# Patient Record
Sex: Female | Born: 1970
Health system: Southern US, Community
[De-identification: ages and names within clinical notes are randomized; demographics above are authoritative.]

## PROBLEM LIST (undated history)

## (undated) DIAGNOSIS — F419 Anxiety disorder, unspecified: Secondary | ICD-10-CM

## (undated) DIAGNOSIS — F4310 Post-traumatic stress disorder, unspecified: Secondary | ICD-10-CM

## (undated) DIAGNOSIS — F319 Bipolar disorder, unspecified: Secondary | ICD-10-CM

## (undated) DIAGNOSIS — M545 Low back pain, unspecified: Secondary | ICD-10-CM

## (undated) DIAGNOSIS — Z86718 Personal history of other venous thrombosis and embolism: Secondary | ICD-10-CM

## (undated) DIAGNOSIS — I890 Lymphedema, not elsewhere classified: Secondary | ICD-10-CM

## (undated) DIAGNOSIS — I872 Venous insufficiency (chronic) (peripheral): Secondary | ICD-10-CM

## (undated) DIAGNOSIS — I824Y2 Acute embolism and thrombosis of unspecified deep veins of left proximal lower extremity: Secondary | ICD-10-CM

## (undated) DIAGNOSIS — M1990 Unspecified osteoarthritis, unspecified site: Secondary | ICD-10-CM

## (undated) DIAGNOSIS — F17200 Nicotine dependence, unspecified, uncomplicated: Secondary | ICD-10-CM

## (undated) DIAGNOSIS — J40 Bronchitis, not specified as acute or chronic: Secondary | ICD-10-CM

## (undated) HISTORY — PX: BREAST BIOPSY: SHX20

## (undated) HISTORY — PX: DILATION AND CURETTAGE OF UTERUS: SHX78

---

## 2002-03-04 ENCOUNTER — Emergency Department (HOSPITAL_COMMUNITY): Admission: EM | Admit: 2002-03-04 | Discharge: 2002-03-04 | Payer: Self-pay | Admitting: Emergency Medicine

## 2003-11-03 ENCOUNTER — Inpatient Hospital Stay: Payer: Self-pay | Admitting: Unknown Physician Specialty

## 2004-09-06 ENCOUNTER — Emergency Department: Payer: Self-pay | Admitting: Emergency Medicine

## 2005-04-18 ENCOUNTER — Inpatient Hospital Stay: Payer: Self-pay | Admitting: Obstetrics & Gynecology

## 2007-06-12 ENCOUNTER — Emergency Department: Payer: Self-pay | Admitting: Emergency Medicine

## 2009-06-10 ENCOUNTER — Ambulatory Visit: Payer: Self-pay | Admitting: Cardiovascular Disease

## 2009-06-10 ENCOUNTER — Emergency Department: Payer: Self-pay | Admitting: Emergency Medicine

## 2010-03-01 NOTE — Procedures (Signed)
Summary: [facility]  [facility]   Imported By: Harlon Flor 06/18/2009 09:57:55  _____________________________________________________________________  External Attachment:    Type:   Image     Comment:   External Document

## 2011-04-03 ENCOUNTER — Emergency Department: Payer: Self-pay | Admitting: Emergency Medicine

## 2012-07-15 ENCOUNTER — Emergency Department: Payer: Self-pay | Admitting: Emergency Medicine

## 2012-07-15 LAB — COMPREHENSIVE METABOLIC PANEL
Albumin: 3.2 g/dL — ABNORMAL LOW (ref 3.4–5.0)
Alkaline Phosphatase: 97 U/L (ref 50–136)
Anion Gap: 9 (ref 7–16)
Bilirubin,Total: 0.3 mg/dL (ref 0.2–1.0)
Chloride: 109 mmol/L — ABNORMAL HIGH (ref 98–107)
Creatinine: 0.86 mg/dL (ref 0.60–1.30)
Osmolality: 278 (ref 275–301)
Potassium: 3.8 mmol/L (ref 3.5–5.1)
SGOT(AST): 27 U/L (ref 15–37)
SGPT (ALT): 25 U/L (ref 12–78)
Sodium: 139 mmol/L (ref 136–145)
Total Protein: 6.6 g/dL (ref 6.4–8.2)

## 2012-07-15 LAB — HCG, QUANTITATIVE, PREGNANCY: Beta Hcg, Quant.: <1 m[IU]/mL — ABNORMAL LOW

## 2012-07-15 LAB — CBC
MCH: 29.4 pg (ref 26.0–34.0)
MCHC: 34.8 g/dL (ref 32.0–36.0)
MCV: 84 fL (ref 80–100)
RBC: 4.76 10*6/uL (ref 3.80–5.20)
RDW: 13 % (ref 11.5–14.5)
WBC: 7.7 10*3/uL (ref 3.6–11.0)

## 2012-07-15 LAB — LIPASE, BLOOD: Lipase: 48 U/L — ABNORMAL LOW (ref 73–393)

## 2012-07-16 LAB — URINALYSIS, COMPLETE
Bacteria: NONE SEEN
Bilirubin,UR: NEGATIVE
Leukocyte Esterase: NEGATIVE
Nitrite: NEGATIVE
Ph: 6 (ref 4.5–8.0)
RBC,UR: 1 /HPF (ref 0–5)
Specific Gravity: 1.013 (ref 1.003–1.030)
Squamous Epithelial: 5

## 2012-07-16 LAB — TROPONIN I: Troponin-I: <0.02 ng/mL

## 2012-12-30 ENCOUNTER — Ambulatory Visit: Payer: Self-pay | Admitting: Internal Medicine

## 2013-01-22 ENCOUNTER — Inpatient Hospital Stay: Payer: Self-pay | Admitting: Internal Medicine

## 2013-01-22 LAB — URINALYSIS, COMPLETE
Bacteria: NONE SEEN
Blood: NEGATIVE
Glucose,UR: NEGATIVE mg/dL (ref 0–75)
Ph: 7 (ref 4.5–8.0)
Protein: NEGATIVE
RBC,UR: 1 /HPF (ref 0–5)
Specific Gravity: 1.01 (ref 1.003–1.030)
Squamous Epithelial: 9
WBC UR: 2 /HPF (ref 0–5)

## 2013-01-22 LAB — BASIC METABOLIC PANEL
BUN: 7 mg/dL (ref 7–18)
Calcium, Total: 8.2 mg/dL — ABNORMAL LOW (ref 8.5–10.1)
Chloride: 107 mmol/L (ref 98–107)
Co2: 22 mmol/L (ref 21–32)
Creatinine: 0.84 mg/dL (ref 0.60–1.30)
EGFR (Non-African Amer.): >60
Glucose: 141 mg/dL — ABNORMAL HIGH (ref 65–99)
Potassium: 3.9 mmol/L (ref 3.5–5.1)

## 2013-01-22 LAB — DRUG SCREEN, URINE
Barbiturates, Ur Screen: NEGATIVE (ref ?–200)
Cocaine Metabolite,Ur ~~LOC~~: NEGATIVE (ref ?–300)
MDMA (Ecstasy)Ur Screen: NEGATIVE (ref ?–500)
Phencyclidine (PCP) Ur S: NEGATIVE (ref ?–25)
Tricyclic, Ur Screen: NEGATIVE (ref ?–1000)

## 2013-01-22 LAB — CBC
HCT: 42 % (ref 35.0–47.0)
MCH: 27.7 pg (ref 26.0–34.0)
MCHC: 32.4 g/dL (ref 32.0–36.0)
RDW: 12.5 % (ref 11.5–14.5)
WBC: 11.1 10*3/uL — ABNORMAL HIGH (ref 3.6–11.0)

## 2013-01-22 LAB — TROPONIN I: Troponin-I: <0.02 ng/mL

## 2013-01-22 LAB — PROTIME-INR: INR: 0.9

## 2013-01-22 LAB — RAPID INFLUENZA A&B ANTIGENS

## 2013-01-23 DIAGNOSIS — I2699 Other pulmonary embolism without acute cor pulmonale: Secondary | ICD-10-CM

## 2013-01-23 LAB — LIPID PANEL
Cholesterol: 165 mg/dL (ref 0–200)
HDL Cholesterol: 49 mg/dL (ref 40–60)
Ldl Cholesterol, Calc: 80 mg/dL (ref 0–100)
Triglycerides: 178 mg/dL (ref 0–200)
VLDL Cholesterol, Calc: 36 mg/dL (ref 5–40)

## 2013-01-23 LAB — HEMOGLOBIN A1C: Hemoglobin A1C: 5 % (ref 4.2–6.3)

## 2013-01-25 LAB — CBC WITH DIFFERENTIAL/PLATELET
Basophil #: 0.1 10*3/uL (ref 0.0–0.1)
Basophil %: 0.9 %
Basophil %: 0.4 %
Eosinophil #: 0.1 10*3/uL (ref 0.0–0.7)
Eosinophil #: 0.1 10*3/uL (ref 0.0–0.7)
Eosinophil %: 0.9 %
Lymphocyte #: 0.9 10*3/uL — ABNORMAL LOW (ref 1.0–3.6)
Lymphocyte %: 11.2 %
MCH: 29.4 pg (ref 26.0–34.0)
MCH: 29.3 pg (ref 26.0–34.0)
MCHC: 34.5 g/dL (ref 32.0–36.0)
MCHC: 34.7 g/dL (ref 32.0–36.0)
Monocyte %: 7.4 %
Neutrophil #: 6.4 10*3/uL (ref 1.4–6.5)
Neutrophil #: 6 10*3/uL (ref 1.4–6.5)
Neutrophil %: 76 %
Platelet: 171 10*3/uL (ref 150–440)
RBC: 4.38 10*6/uL (ref 3.80–5.20)
RDW: 12.2 % (ref 11.5–14.5)
WBC: 7.6 10*3/uL (ref 3.6–11.0)

## 2013-01-25 LAB — APTT
Activated PTT: 34 secs (ref 23.6–35.9)
Activated PTT: 39.5 secs — ABNORMAL HIGH (ref 23.6–35.9)

## 2013-01-26 LAB — APTT
Activated PTT: 44.7 secs — ABNORMAL HIGH (ref 23.6–35.9)
Activated PTT: 47 secs — ABNORMAL HIGH (ref 23.6–35.9)

## 2013-01-26 LAB — CBC WITH DIFFERENTIAL/PLATELET
Basophil #: 0 10*3/uL (ref 0.0–0.1)
Basophil %: 0.6 %
Eosinophil %: 1.7 %
Lymphocyte #: 1.4 10*3/uL (ref 1.0–3.6)
MCV: 85 fL (ref 80–100)
Monocyte #: 0.5 x10 3/mm (ref 0.2–0.9)
Neutrophil #: 5.6 10*3/uL (ref 1.4–6.5)
RBC: 4.43 10*6/uL (ref 3.80–5.20)
WBC: 7.7 10*3/uL (ref 3.6–11.0)

## 2013-01-27 LAB — CREATININE, SERUM: EGFR (African American): >60

## 2013-01-27 LAB — APTT
Activated PTT: 50.9 secs — ABNORMAL HIGH (ref 23.6–35.9)
Activated PTT: 58.7 secs — ABNORMAL HIGH (ref 23.6–35.9)

## 2013-01-27 LAB — HEMOGLOBIN: HGB: 13.4 g/dL (ref 12.0–16.0)

## 2013-01-30 ENCOUNTER — Ambulatory Visit: Payer: Self-pay | Admitting: Internal Medicine

## 2013-02-07 ENCOUNTER — Ambulatory Visit: Payer: Self-pay | Admitting: Internal Medicine

## 2013-02-07 LAB — HEPATIC FUNCTION PANEL A (ARMC)
ALBUMIN: 3.3 g/dL — AB (ref 3.4–5.0)
ALT: 22 U/L (ref 12–78)
Alkaline Phosphatase: 83 U/L
Bilirubin, Direct: <0.1 mg/dL (ref 0.00–0.20)
Bilirubin,Total: 0.2 mg/dL (ref 0.2–1.0)
SGOT(AST): 20 U/L (ref 15–37)
Total Protein: 7.1 g/dL (ref 6.4–8.2)

## 2013-02-19 ENCOUNTER — Ambulatory Visit: Payer: Self-pay | Admitting: Family Medicine

## 2013-02-24 ENCOUNTER — Ambulatory Visit: Payer: Self-pay | Admitting: Family Medicine

## 2013-02-26 LAB — CA 125: CA 125: 21.9 U/mL (ref 0.0–34.0)

## 2013-02-26 LAB — CEA: CEA: 2.3 ng/mL (ref 0.0–4.7)

## 2013-02-26 LAB — CANCER ANTIGEN 19-9: CA 19-9: 3 U/mL (ref 0–35)

## 2013-03-02 ENCOUNTER — Ambulatory Visit: Payer: Self-pay | Admitting: Internal Medicine

## 2013-03-10 LAB — CBC CANCER CENTER
BASOS PCT: 0.9 %
Basophil #: 0.1 x10 3/mm (ref 0.0–0.1)
Eosinophil #: 0.1 x10 3/mm (ref 0.0–0.7)
Eosinophil %: 0.9 %
HCT: 43.6 % (ref 35.0–47.0)
HGB: 14.7 g/dL (ref 12.0–16.0)
LYMPHS PCT: 26.2 %
Lymphocyte #: 2.5 x10 3/mm (ref 1.0–3.6)
MCH: 28.9 pg (ref 26.0–34.0)
MCHC: 33.7 g/dL (ref 32.0–36.0)
MCV: 86 fL (ref 80–100)
MONO ABS: 0.2 x10 3/mm (ref 0.2–0.9)
Monocyte %: 2.4 %
NEUTROS PCT: 69.6 %
Neutrophil #: 6.7 x10 3/mm — ABNORMAL HIGH (ref 1.4–6.5)
PLATELETS: 203 x10 3/mm (ref 150–440)
RBC: 5.07 10*6/uL (ref 3.80–5.20)
RDW: 13.2 % (ref 11.5–14.5)
WBC: 9.6 x10 3/mm (ref 3.6–11.0)

## 2013-03-11 LAB — PROT IMMUNOELECTROPHORES(ARMC)

## 2013-03-30 ENCOUNTER — Ambulatory Visit: Payer: Self-pay | Admitting: Internal Medicine

## 2013-04-05 LAB — CANCER ANTIGEN 27.29: CA 27.29: 11 U/mL (ref 0.0–38.6)

## 2013-04-17 ENCOUNTER — Other Ambulatory Visit (HOSPITAL_COMMUNITY): Payer: Self-pay | Admitting: Internal Medicine

## 2013-04-18 ENCOUNTER — Ambulatory Visit: Payer: Self-pay | Admitting: Surgery

## 2013-04-21 LAB — PATHOLOGY REPORT

## 2013-04-30 ENCOUNTER — Ambulatory Visit: Payer: Self-pay | Admitting: Internal Medicine

## 2013-05-30 ENCOUNTER — Ambulatory Visit: Payer: Self-pay | Admitting: Internal Medicine

## 2013-06-30 ENCOUNTER — Ambulatory Visit: Payer: Self-pay | Admitting: Internal Medicine

## 2013-07-29 LAB — CBC CANCER CENTER
BASOS ABS: 0 x10 3/mm (ref 0.0–0.1)
Basophil %: 0.7 %
Eosinophil #: 0.1 x10 3/mm (ref 0.0–0.7)
Eosinophil %: 1.4 %
HCT: 45.8 % (ref 35.0–47.0)
HGB: 15.4 g/dL (ref 12.0–16.0)
LYMPHS ABS: 1.9 x10 3/mm (ref 1.0–3.6)
LYMPHS PCT: 36.2 %
MCH: 29.6 pg (ref 26.0–34.0)
MCHC: 33.5 g/dL (ref 32.0–36.0)
MCV: 88 fL (ref 80–100)
Monocyte #: 0.3 x10 3/mm (ref 0.2–0.9)
Monocyte %: 5.1 %
NEUTROS ABS: 3 x10 3/mm (ref 1.4–6.5)
Neutrophil %: 56.6 %
Platelet: 150 x10 3/mm (ref 150–440)
RBC: 5.19 10*6/uL (ref 3.80–5.20)
RDW: 13.2 % (ref 11.5–14.5)
WBC: 5.3 x10 3/mm (ref 3.6–11.0)

## 2013-07-29 LAB — D-DIMER(ARMC): D-DIMER: 108 ng/mL

## 2013-07-30 ENCOUNTER — Ambulatory Visit: Payer: Self-pay | Admitting: Internal Medicine

## 2013-10-22 ENCOUNTER — Ambulatory Visit: Payer: Self-pay | Admitting: Family Medicine

## 2014-05-22 NOTE — H&P (Signed)
PATIENT NAME:  Colleen Campos, Colleen Campos MR#:  [id number] DATE OF BIRTH:  09-04-1970  DATE OF ADMISSION:  01/22/2013  PRIMARY CARE PHYSICIAN: Dr. Burnett ShengHedrick.   CHIEF COMPLAINT: Bilateral lower chest pain, more on the left side. Radiating to arms, worse with deep breathing and coughing.   Pain started acutely at 1:00 a.m. at night waking the patient up from sleep. She was feeling fine before going to sleep. Has had trouble breathing with shallow breathing. Pain on taking a deep breath. No nausea or vomiting, diarrhea, abdominal pain, lightheadedness, syncope, or productive cough.   CT scan of the chest done today in the Emergency Room, has shown bilateral pulmonary emboli. The patient is not hypoxic, but severely tachypneic into the 30s and shallow breathing.   PAST MEDICAL HISTORY: Anxiety and insomnia.   FAMILY HISTORY: Father had blood clots in the lungs and legs.   SOCIAL HISTORY: The patient does not smoke. No alcohol. No illicit drugs. Lives at home with her husband and two kids.   ALLERGIES: No known drug allergies.   REVIEW OF SYSTEMS:  CONSTITUTIONAL: Complains of fatigue.  EYES: No blurry vision, pain or redness. ENT: No tinnitus, ear pain, hearing loss. RESPIRATORY: Has shallow breathing, chest pain.  CARDIOVASCULAR: Chest pain. No orthopnea, edema.  GASTROINTESTINAL: No nausea, vomiting, diarrhea, abdominal pain.  GENITOURINARY: No dysuria, hematuria, frequency.  ENDOCRINE: No polyuria, nocturia, thyroid problems.  HEMATOLOGIC AND LYMPHATIC: No anemia, easy bruising, bleeding.   INTEGUMENTARY: No acne, rash, lesion.  MUSCULOSKELETAL: No back pain, arthritis.  NEUROLOGIC: No focal numbness, weakness, seizure.  PSYCHIATRIC: Has anxiety and insomnia. No depression.   HOME MEDICATIONS: Include:  1. Seroquel 200 mg oral at nighttime.  2. Chlordiazepoxide 7.5 mg oral 4 times a day.  3. Motrin 200 mg 2 tablets oral as needed for pain.   PHYSICAL EXAMINATION: VITAL SIGNS: Temperature  98.2, pulse of 82, blood pressure 117/59, saturating 92% on room air, breathing 30 per minute.  GENERAL: Obese Caucasian female patient sitting up in bed in significant distress secondary to her shortness of breath and chest pain.  PSYCHIATRIC: Alert and oriented x 3, anxious, less restlessness.  HEENT: Atraumatic, normocephalic. Oral mucosa moist and pink. External ears and nose normal. No pallor or icterus. Pupils bilaterally equal and react to light.  NECK: Supple. No thyromegaly or palpable lymph nodes. Trachea midline. No carotid bruits or JVD.  CARDIOVASCULAR: S1, S2, without any murmurs. Peripheral pulses 2+. Has chest wall tenderness bilaterally in the lower part.  GASTROINTESTINAL: Soft abdomen, nontender. Bowel sounds present. No hepatosplenomegaly palpable.  SKIN: Warm and dry. No petechiae, rash, ulcers.  MUSCULOSKELETAL: No joint swelling, redness, effusion of the large joints. Normal muscle tone.  NEUROLOGICAL: Motor strength five out of five in upper and lower extremities. Sensation is intact all over.  LYMPHATIC: No cervical lymphadenopathy.   LABORATORY STUDIES: Show glucose 141, BUN 7, creatinine 0.84, sodium 136, potassium 3.9. Troponin less than 0.02.   , hemoglobin 13.6, platelets of 162 with d-dimer elevated at 0.59. Influenza negative. Urinalysis shows no bacteria. Pregnancy test negative.   CT scan of the chest showed bilateral pulmonary emboli in right middle lobe, left lower lobe. No evidence of right heart strain. Dependent atelectasis in both bases. A small left pleural effusion.   EKG shows normal sinus rhythm. Does haveT wave inversions in V2 lead.   ASSESSMENT AND PLAN:  1. Bilateral pulmonary emboli, which are unprovoked in a patient with no risk factors of recent surgeries, long travel, immobility or  prior clots. The patient is not on any oral contraceptive pills. She mentions family history in her father. Will need hypercoagulability work-up, but cannot checked  these with the acute clots. The patient will need heme on follow up after discharge for her hypercoagulability work-up. We will start the patient on Lovenox at therapeutic dose. We will check with social worker tomorrow if Xarelto is covered with her insurance. Hopefully, the patient can be discharged home with Xarelto when she is better. We will get an echocardiogram and also lower extremity Dopplers.  2. Acute respiratory failure. The patient does have shallow breathing with tachypnea using accessory muscles. We will put her on oxygen p.r.n.  3. Anxiety. Continue home medications.  4. CODE STATUS: FULL CODE.   TIME SPENT TODAY ON THIS CASE: 55 minutes of critical care time.    ____________________________ Molinda Bailiff Tzion Wedel, MD srs:sg D: 01/22/2013 13:18:20 ET T: 01/22/2013 13:52:46 ET JOB#: [id number]  cc: Wardell Heath R. Alverto Shedd, MD, <Dictator> Orie Fisherman MD ELECTRONICALLY SIGNED 01/22/2013 14:23

## 2014-05-22 NOTE — Consult Note (Signed)
Brief Consult Note: Diagnosis: PE.   Patient was seen by consultant.   Orders entered.   Comments: DICTATED NOTE TO FOLLOW    HX UNPROVOKED PE   SUGGEST  I HAVE ORDERED SOME PRLIMINARY HYPERCOAG STUDIES. I WOULD NOT USE ANY NSAIDS. I HAVE ORDERED SOME TUMOR MARKERS. PATIENT WILL ALSO NEED AGE APPROPRIATE CANCER SCREENING, SHOICH WOULD BE A MAMMOGRAM  I WILL FOLLOW PATIENT PROGRESS DAILY, IF D/C BEFORE 12/29 SHOULD CALL CANCER CENTER ON MONDAY 12/29, [phone], AND REQUEST APPT IN 1 MONTH.  Electronic Signatures: Marin RobertsGittin, Zelina Jimerson G (MD)  (Signed 26-Dec-14 17:48)  Authored: Brief Consult Note   Last Updated: 26-Dec-14 17:48 by Marin RobertsGittin, Quantavious Eggert G (MD)

## 2014-05-23 NOTE — Consult Note (Signed)
PATIENT NAME:  Colleen Campos, Colleen Campos MR#:  [id number] DATE OF BIRTH:  08/09/1970  DATE OF CONSULTATION:  01/24/2014  REFERRING PHYSICIAN:   CONSULTING PHYSICIAN:  Knute Neuobert G. Lorre NickGittin, MD  HISTORY OF PRESENT ILLNESS: Ms. Colleen Campos is a 44 year old patient who was admitted on December 24th with pulmonary embolism. She woke with trouble breathing and pleuritic pain, had a CT of the chest, had bilateral PE demonstrated and was started on anticoagulation. The patient was doing well, no acute issues, but was still having some shortness of breath and pleuritic pain that was requiring opiates.   PAST MEDICAL HISTORY:  The patient had past medical history of anxiety and insomnia.   FAMILY HISTORY: Father had blood clots, the details and age not currently known. History would be reviewed and additional information updated.   HABITS:  No alcohol. No tobacco.   ALLERGIES:  No known drug allergies.   MEDICATION AT THE TIME OF ADMISSION:  Seroquel 20 mg every night, chlordiazepoxide 4 times a day and Motrin p.r.n.   SYSTEM REVIEW: When seen was having pleuritic pain. Mild shortness of breath. No headache. No dizziness. No ear or jaw pain. No visual disturbances. No retrosternal heaviness. No cough. No wheezing. No abdominal pain. No nausea, vomiting or diarrhea. No edema. No bruising or rash. No bone pain. No joint pain.   LABORATORY, DIAGNOSTIC AND RADIOLOGICAL DATA: Creatinine 0.84. The troponin was low. The hemoglobin was 13.6, platelets 162. The D-dimer was elevated. Flu test was negative. CT of the chest as noted but with no mass shown. EKG was unremarkable.   PHYSICAL EXAMINATION: GENERAL: Alert, cooperative and in some distress with some pain.  HEENT: Sclerae no jaundice.  MOUTH: No thrush.  LYMPH: No palpable lymph nodes in the neck, supraclavicular, submandibular or axilla.  LUNGS: Clear. No wheezing or rales.  HEART: Regular.  ABDOMEN: Nontender. No palpable mass or organomegaly.  EXTREMITIES: No  extremity edema.  NEUROLOGIC: Grossly nonfocal.  SKIN:  No rash or bruising.   IMPRESSION AND PLAN: The patient with an unprovoked pulmonary embolism. On questioning, there was no injury, immobility, surgeries, long travel. Notably, in the history that back in June the patient was in the Emergency Room for what was called abdominal, upper abdominal and lower chest pain. The patient states that on the right side. Symptoms were very similar to the current pain. Those symptoms abated within a day or 2.   PLAN: The patient is currently on anticoagulation. I had recommended no Toradol, no nonsteroidals. The patient was going to remain hospitalized until there was no hypoxia, no pleuritic pain requiring narcotics and was going to go home on Xarelto. Later there was trace hemoptysis which resolved. The patient was on heparin and Lovenox, on heparin and then home on Xarelto. As stated, CBC and creatinine and calcium are normal. Liver functions can be documented later. D-dimer will be repeated. Multiple coag studies will be checked, a number of which have been sent but were initially pending. Doppler ultrasound of the lower extremities has already been done and was negative. Echo Doppler had been done and showed some increased pulmonary artery systolic pressures, otherwise unremarkable. Followup would include reviewing the coag studies for inherited and acquired disorders. Tumor markers. Age-appropriate cancer screening which should include mammogram at age 44. Followup planned in the Cancer Center.  ____________________________ Knute Neuobert G. Lorre NickGittin, MD rgg:cs D: 02/02/2013 18:31:26 ET T: 02/02/2013 19:45:57 ET JOB#: [id number]  cc: Knute Neuobert G. Lorre NickGittin, MD, <Dictator>

## 2014-05-23 NOTE — Discharge Summary (Signed)
PATIENT NAME:  Colleen Campos, Colleen Campos MR#:  [id number] DATE OF BIRTH:  09/16/1970  DATE OF ADMISSION:  01/22/2013 DATE OF DISCHARGE:  01/27/2013  DISCHARGE DIAGNOSES:  1.  Bilateral pulmonary embolism, unprovoked. 2.  Anxiety.  3.  Acute respiratory failure due to pulmonary embolism.  4.  Pleuritic chest pain.   DISCHARGE MEDICATIONS: 1.  Seroquel 100 mg two tablets once a day.  2.  Clorazepate 7.5 mg p.o. 4 times daily.  3.  Motrin 200 mg two tablets as needed.  4.  Endocet 5/325, one tablet every 6 hours.  5.  Xarelto 15 mg p.o. b.i.d. with meals for 21 days and then after that 20 mg daily.   CONSULTATIONS: Oncology consult, Dr. Lorre NickGittin.     HOSPITAL COURSE: This is a 44 year old female patient with history of anxiety who came in because of shortness of breath. The patient also had pleuritic chest pain especially on the left side, moderate coughing, and these symptoms started suddenly.   CT chest in the Emergency Room showed bilateral PE. The patient has been admitted to the hospitalist service. A DVT study in the legs was negative. The patient was started on Lovenox and changed to heparin because of her hemoptysis. The patient did not have any hemoglobin drop even with hemoptysis. Hemoptysis thought to be secondary to PE and we changed heparin back to Xarelto and discharged her home. The patient felt very short of breath even with minimal exertion. We checked the O2 sats on room air at rest on exertion.  The patient's O2 sats on exertion on room air were 91%,  at rest, 93%. I did advise her to take it slowly and rest at home and follow up with Dr. Lorre NickGittin. The patient was seen by Dr. Lorre NickGittin.  He wanted her to follow up in the clinic, and we have ordered ANA levels, CA99, antiphospholipid antibody levels and also factor V Leiden, factor II mutation, antiphospholipid labs.   The patient's ANA level was not negative and CEA level was 1.6. Antiphospholipid antibodies were negative for IgG and IgM. CA99  also is 4 and CA125 is only 15.4, which are all within normal limits. The patient's antiphospholipid antibody levels are also within normal limits. No lupus anticoagulant was detected. The patient's protein C is normal. Protein S also is normal and protein S3 also is normal.   The patient had an echocardiogram which showed normal EF 55% to 60%.   The patient's other labs significant for an LDL of 80. Hemoglobin A1c 5.  A lower extremity ultrasound did not show any DVT.  Influenza test was negative. The patient thought to have unprovoked DVT, likely secondary to obesity with BMI of 31.4.   DISPOSITION: She went home in stable condition on Xarelto and also follow up with Dr. Lorre NickGittin.  The patient's condition at the time of discharge was  stable.   TIME SPENT ON DISCHARGE PREPARATION: More than 30 minutes.   ____________________________ Katha HammingSnehalatha Mercedees Convery, MD sk:np D: 01/28/2013 13:28:21 ET T: 01/28/2013 15:51:18 ET JOB#: [id number]  cc: Katha HammingSnehalatha Corretta Munce, MD, <Dictator> Katha HammingSNEHALATHA Halen Mossbarger MD ELECTRONICALLY SIGNED 02/02/2013 17:24

## 2014-05-23 NOTE — Consult Note (Signed)
PATIENT NAME:  Colleen Campos, Colleen Campos MR#:  [id number] DATE OF BIRTH:  Jan 23, 1971  DATE OF CONSULTATION:  01/24/2013  REFERRING PHYSICIAN:   CONSULTING PHYSICIAN:  Knute Neuobert G. Lorre NickGittin, MD  HISTORY OF PRESENT ILLNESS: Ms. Colleen Campos is a 44 year old patient who was admitted on December 24th with pulmonary embolism. She woke with trouble breathing and pleuritic pain, had a CT of the chest, had bilateral PE demonstrated and was started on anticoagulation. The patient was doing well, no acute issues, but was still having some shortness of breath and pleuritic pain that was requiring opiates.   PAST MEDICAL HISTORY:  The patient had past medical history of anxiety and insomnia.   FAMILY HISTORY: Father had blood clots, the details and age not currently known. History would be reviewed and additional information updated.   HABITS:  No alcohol. No tobacco.   ALLERGIES:  No known drug allergies.   MEDICATION AT THE TIME OF ADMISSION:  Seroquel 20 mg every night, chlordiazepoxide 4 times a day and Motrin p.r.n.   SYSTEM REVIEW: When seen was having pleuritic pain. Mild shortness of breath. No headache. No dizziness. No ear or jaw pain. No visual disturbances. No retrosternal heaviness. No cough. No wheezing. No abdominal pain. No nausea, vomiting or diarrhea. No edema. No bruising or rash. No bone pain. No joint pain.   LABORATORY, DIAGNOSTIC AND RADIOLOGICAL DATA: Creatinine 0.84. The troponin was low. The hemoglobin was 13.6, platelets 162. The D-dimer was elevated. Flu test was negative. CT of the chest as noted but with no mass shown. EKG was unremarkable.   PHYSICAL EXAMINATION: GENERAL: Alert, cooperative and in some distress with some pain.  HEENT: Sclerae no jaundice.  MOUTH: No thrush.  LYMPH: No palpable lymph nodes in the neck, supraclavicular, submandibular or axilla.  LUNGS: Clear. No wheezing or rales.  HEART: Regular.  ABDOMEN: Nontender. No palpable mass or organomegaly.  EXTREMITIES: No  extremity edema.  NEUROLOGIC: Grossly nonfocal.  SKIN:  No rash or bruising.   IMPRESSION AND PLAN: The patient with an unprovoked pulmonary embolism. On questioning, there was no injury, immobility, surgeries, long travel. Notably, in the history that back in June the patient was in the Emergency Room for what was called abdominal, upper abdominal and lower chest pain. The patient states that on the right side. Symptoms were very similar to the current pain. Those symptoms abated within a day or 2.   PLAN: The patient is currently on anticoagulation. I had recommended no Toradol, no nonsteroidals. The patient was going to remain hospitalized until there was no hypoxia, no pleuritic pain requiring narcotics and was going to go home on Xarelto. Later there was trace hemoptysis which resolved. The patient was on heparin and Lovenox, on heparin and then home on Xarelto. As stated, CBC and creatinine and calcium are normal. Liver functions can be documented later. D-dimer will be repeated. Multiple coag studies will be checked, a number of which have been sent but were initially pending. Doppler ultrasound of the lower extremities has already been done and was negative. Echo Doppler had been done and showed some increased pulmonary artery systolic pressures, otherwise unremarkable. Followup would include reviewing the coag studies for inherited and acquired disorders. Tumor markers. Age-appropriate cancer screening which should include mammogram at age 44. Followup planned in the Cancer Center.  ____________________________ Knute Neuobert G. Lorre NickGittin, MD rgg:cs D: 02/02/2013 18:31:00 ET T: 02/02/2013 19:45:57 ET JOB#: [id number]  cc: Knute Neuobert G. Lorre NickGittin, MD, <Dictator> Marin RobertsOBERT G Effrey Davidow MD ELECTRONICALLY SIGNED 03/14/2013 18:13

## 2014-12-22 ENCOUNTER — Encounter: Payer: Self-pay | Admitting: Emergency Medicine

## 2014-12-22 ENCOUNTER — Emergency Department
Admission: EM | Admit: 2014-12-22 | Discharge: 2014-12-22 | Disposition: A | Discharge status: Home / Self Care | Payer: 59 | Attending: Emergency Medicine | Admitting: Emergency Medicine

## 2014-12-22 ENCOUNTER — Emergency Department: Payer: 59

## 2014-12-22 DIAGNOSIS — F17200 Nicotine dependence, unspecified, uncomplicated: Secondary | ICD-10-CM | POA: Diagnosis not present

## 2014-12-22 DIAGNOSIS — F419 Anxiety disorder, unspecified: Secondary | ICD-10-CM | POA: Diagnosis not present

## 2014-12-22 DIAGNOSIS — M79605 Pain in left leg: Secondary | ICD-10-CM | POA: Insufficient documentation

## 2014-12-22 HISTORY — DX: Personal history of other venous thrombosis and embolism: Z86.718

## 2014-12-22 HISTORY — DX: Bipolar disorder, unspecified: F31.9

## 2014-12-22 HISTORY — DX: Post-traumatic stress disorder, unspecified: F43.10

## 2014-12-22 HISTORY — DX: Anxiety disorder, unspecified: F41.9

## 2014-12-22 NOTE — ED Notes (Signed)
MD Kinner at bedside  

## 2014-12-22 NOTE — ED Notes (Signed)
Patient transported to Ultrasound at this time. 

## 2014-12-22 NOTE — Discharge Instructions (Signed)

## 2014-12-22 NOTE — ED Notes (Signed)
Pt to ed with c/o left leg pain that started at left knee and radiates down left leg.  Pt denies injury.  Pt states hx of blood clots.  Pt denies sob.

## 2014-12-22 NOTE — ED Provider Notes (Signed)
[facility] Emergency Department Provider Note  ____________________________________________  Time seen: On arrival  I have reviewed the triage vital signs and the nursing notes.   HISTORY  Chief Complaint Leg Pain    HPI Colleen Campos is a 44 y.o. female who presents with complaints of left lower leg pain primarily in the calf. She reports the pain is mild to moderate starts behind for left knee and radiates down into her left calf. She denies redness or swelling. She does report a history of blood clots including a pulmonary embolism for which she was on Xarelto for 6 months. She is no longer on blood thinners. She denies fevers chills. No recent travel. No injury to the area.She does smoke cigarettes     Past Medical History  Diagnosis Date  . History of blood clots   . Bipolar 1 disorder (HCC)   . Anxiety   . PTSD (post-traumatic stress disorder)     There are no active problems to display for this patient.   History reviewed. No pertinent past surgical history.  No current outpatient prescriptions on file.  Allergies Review of patient's allergies indicates no known allergies.  History reviewed. No pertinent family history.  Social History Social History  Substance Use Topics  . Smoking status: Current Every Day Smoker  . Smokeless tobacco: None  . Alcohol Use: Yes    Review of Systems  Constitutional: Negative for fever. Eyes: Negative for visual changes. ENT: Negative for sore throat Cardiovascular: Negative for chest pain. No pleurisy Respiratory: Negative for shortness of breath. No cough Gastrointestinal: Negative for abdominal pain, vomiting and diarrhea. Genitourinary: Negative for dysuria. Musculoskeletal: Negative for back pain. Left lower leg discomfort as above Skin: Negative for rash. Neurological: Negative for headaches or focal weakness Psychiatric: Positive for  anxiety    ____________________________________________   PHYSICAL EXAM:  VITAL SIGNS: ED Triage Vitals  Enc Vitals Group     BP 12/22/14 1056 153/85 mmHg     Pulse Rate 12/22/14 1056 123     Resp 12/22/14 1056 18     Temp 12/22/14 1056 98.2 F (36.8 C)     Temp Source 12/22/14 1056 Oral     SpO2 12/22/14 1056 96 %     Weight 12/22/14 1056 240 lb (108.863 kg)     Height 12/22/14 1056  (1.702 m)     Head Cir --      Peak Flow --      Pain Score 12/22/14 1052 5     Pain Loc --      Pain Edu? --      Excl. in GC? --      Constitutional: Alert and oriented. Well appearing and in no distress. Eyes: Conjunctivae are normal.  ENT   Head: Normocephalic and atraumatic.   Mouth/Throat: Mucous membranes are moist. Cardiovascular: Normal rate, regular rhythm. Normal and symmetric distal pulses are present in all extremities. No murmurs, rubs, or gallops. Respiratory: Normal respiratory effort without tachypnea nor retractions. Scattered wheezes Gastrointestinal: Soft and non-tender in all quadrants. No distention. There is no CVA tenderness. Genitourinary: deferred Musculoskeletal: Nontender with normal range of motion in all extremities. No lower extremity redness nor edema. 2+ distal pulses. Compartments soft, no pallor Neurologic:  Normal speech and language. No gross focal neurologic deficits are appreciated. Skin:  Skin is warm, dry and intact. No rash noted. Psychiatric: Mood and affect are normal. Patient exhibits appropriate insight and judgment.  ____________________________________________    LABS (  pertinent positives/negatives)  Labs Reviewed - No data to display  ____________________________________________   EKG  None  ____________________________________________    RADIOLOGY I have personally reviewed any xrays that were ordered on this patient: Ultrasound lower extremity negative for  DVT  ____________________________________________   PROCEDURES  Procedure(s) performed: none  Critical Care performed: none  ____________________________________________   INITIAL IMPRESSION / ASSESSMENT AND PLAN / ED COURSE  Pertinent labs & imaging results that were available during my care of the patient were reviewed by me and considered in my medical decision making (see chart for details).  Patient presents with left calf discomfort. We will ultrasound given her history of DVT  Ultrasound shows no DVT, heart rate rapidly improved on its own without intervention, likely related to anxiety. She is greatly relieved that there is no DVT.  ____________________________________________   FINAL CLINICAL IMPRESSION(S) / ED DIAGNOSES  Final diagnoses:  Pain of left lower extremity     Jene Everyobert Truitt Cruey, MD 12/22/14 1300

## 2014-12-22 NOTE — ED Notes (Signed)
Pt returned from [facility] at this time.

## 2015-12-01 ENCOUNTER — Other Ambulatory Visit (INDEPENDENT_AMBULATORY_CARE_PROVIDER_SITE_OTHER): Payer: Self-pay | Admitting: Vascular Surgery

## 2015-12-01 DIAGNOSIS — I82532 Chronic embolism and thrombosis of left popliteal vein: Secondary | ICD-10-CM

## 2015-12-06 ENCOUNTER — Ambulatory Visit (INDEPENDENT_AMBULATORY_CARE_PROVIDER_SITE_OTHER): Payer: Self-pay | Admitting: Vascular Surgery

## 2015-12-06 ENCOUNTER — Encounter (INDEPENDENT_AMBULATORY_CARE_PROVIDER_SITE_OTHER): Payer: 59

## 2016-03-20 DIAGNOSIS — Z79899 Other long term (current) drug therapy: Secondary | ICD-10-CM | POA: Diagnosis not present

## 2016-04-19 ENCOUNTER — Other Ambulatory Visit (INDEPENDENT_AMBULATORY_CARE_PROVIDER_SITE_OTHER): Payer: Self-pay | Admitting: Vascular Surgery

## 2016-05-22 ENCOUNTER — Telehealth (INDEPENDENT_AMBULATORY_CARE_PROVIDER_SITE_OTHER): Payer: Self-pay | Admitting: Vascular Surgery

## 2016-05-22 ENCOUNTER — Telehealth (INDEPENDENT_AMBULATORY_CARE_PROVIDER_SITE_OTHER): Payer: Self-pay

## 2016-05-22 NOTE — Telephone Encounter (Signed)
Called the patient back to let her know to come by the office to pick up samples and a benefir card. She states that she will call her insurance company in the meantime to see what prescription blood thinner will be at a low/no cost to her. She states that she will be by the office today for the samples.

## 2016-05-22 NOTE — Telephone Encounter (Signed)
It is helpful if the patient calls their insurance to find out which blood thinner is covered (i.e. Xarelto, Savaysa). She can come to the office and get some samples and a benefit card to see if it lowers the price. Let me know.

## 2016-05-22 NOTE — Telephone Encounter (Signed)
Patient came into the office and was given a month's supply of Eliquis and a benefit card for a free month as well.

## 2016-05-22 NOTE — Telephone Encounter (Signed)
PT STATES ELIQUIS IS $85 A MONTH AND SHE CAN NOT AFFORD THAT WANTS TO KNOW IF SHE CAN GET AN ALTERNATIVE MEDICATION THAT IS NOT AS EXPENSIVE.

## 2016-05-30 ENCOUNTER — Emergency Department: Payer: 59

## 2016-05-30 ENCOUNTER — Encounter: Payer: Self-pay | Admitting: Medical Oncology

## 2016-05-30 ENCOUNTER — Emergency Department
Admission: EM | Admit: 2016-05-30 | Discharge: 2016-05-30 | Disposition: A | Discharge status: Home / Self Care | Payer: 59 | Attending: Emergency Medicine | Admitting: Emergency Medicine

## 2016-05-30 DIAGNOSIS — F17200 Nicotine dependence, unspecified, uncomplicated: Secondary | ICD-10-CM | POA: Insufficient documentation

## 2016-05-30 DIAGNOSIS — M170 Bilateral primary osteoarthritis of knee: Secondary | ICD-10-CM | POA: Insufficient documentation

## 2016-05-30 DIAGNOSIS — M2540 Effusion, unspecified joint: Secondary | ICD-10-CM

## 2016-05-30 DIAGNOSIS — Z79899 Other long term (current) drug therapy: Secondary | ICD-10-CM | POA: Diagnosis not present

## 2016-05-30 DIAGNOSIS — M25561 Pain in right knee: Secondary | ICD-10-CM | POA: Diagnosis not present

## 2016-05-30 DIAGNOSIS — M79606 Pain in leg, unspecified: Secondary | ICD-10-CM

## 2016-05-30 DIAGNOSIS — M1712 Unilateral primary osteoarthritis, left knee: Secondary | ICD-10-CM | POA: Diagnosis not present

## 2016-05-30 DIAGNOSIS — M7989 Other specified soft tissue disorders: Secondary | ICD-10-CM | POA: Diagnosis not present

## 2016-05-30 DIAGNOSIS — M25461 Effusion, right knee: Secondary | ICD-10-CM | POA: Diagnosis not present

## 2016-05-30 DIAGNOSIS — Z86718 Personal history of other venous thrombosis and embolism: Secondary | ICD-10-CM

## 2016-05-30 DIAGNOSIS — M25562 Pain in left knee: Secondary | ICD-10-CM | POA: Diagnosis not present

## 2016-05-30 DIAGNOSIS — M79661 Pain in right lower leg: Secondary | ICD-10-CM | POA: Diagnosis not present

## 2016-05-30 DIAGNOSIS — M1711 Unilateral primary osteoarthritis, right knee: Secondary | ICD-10-CM | POA: Diagnosis not present

## 2016-05-30 DIAGNOSIS — M25569 Pain in unspecified knee: Secondary | ICD-10-CM

## 2016-05-30 MED ORDER — TRAMADOL HCL 50 MG PO TABS: 50.0000 mg | ORAL_TABLET | Freq: Four times a day (QID) | ORAL | 0 refills | Status: AC | PRN

## 2016-05-30 MED ORDER — ACETAMINOPHEN 325 MG PO TABS
650.0000 mg | ORAL_TABLET | Freq: Once | ORAL | Status: AC
  Administered 2016-05-30: 650 mg via ORAL
  Filled 2016-05-30: qty 2

## 2016-05-30 MED ORDER — KETOROLAC TROMETHAMINE 60 MG/2ML IM SOLN
30.0000 mg | Freq: Once | INTRAMUSCULAR | Status: AC
  Administered 2016-05-30: 30 mg via INTRAMUSCULAR
  Filled 2016-05-30: qty 2

## 2016-05-30 NOTE — ED Provider Notes (Signed)
[facility] Emergency Department Provider Note  ____________________________________________  Time seen: Approximately 8:05 AM  I have reviewed the triage vital signs and the nursing notes.   HISTORY  Chief Complaint Knee Pain    HPI Colleen Campos is a 46 y.o. female that presents to the emergency department with bilateral knee pain. Pain is worse with walking. Pain has been worse the last week. Patient states that she hit right leg a couple of weeks ago on furniture while playing with the grandkids. She has a history of blood clots and is very concerned for another. She had a blood clot in left leg in late 2016. She takes Eliquis. She denies fever, shortness of breath, chest pain, nausea, vomiting, abdominal pain, calf pain, numbness, tingling.   Past Medical History:  Diagnosis Date  . Anxiety   . Bipolar 1 disorder (HCC)   . History of blood clots   . PTSD (post-traumatic stress disorder)     There are no active problems to display for this patient.   History reviewed. No pertinent surgical history.  Prior to Admission medications   Medication Sig Start Date End Date Taking? Authorizing Provider  ALPRAZolam Prudy Feeler) 1 MG tablet Take 1 mg by mouth 3 (three) times daily as needed for anxiety.   Yes Historical Provider, MD  QUEtiapine (SEROQUEL) 300 MG tablet Take 300 mg by mouth at bedtime.   Yes Historical Provider, MD  ELIQUIS 5 MG TABS tablet TAKE ONE TABLET BY MOUTH TWICE DAILY 04/19/16   Ranae Plumber Stegmayer, PA-C  traMADol (ULTRAM) 50 MG tablet Take 1 tablet (50 mg total) by mouth every 6 (six) hours as needed. 05/30/16 05/30/17  Enid Derry, PA-C    Allergies Patient has no known allergies.  No family history on file.  Social History Social History  Substance Use Topics  . Smoking status: Current Every Day Smoker  . Smokeless tobacco: Never Used  . Alcohol use Yes     Review of Systems  Constitutional: No fever/chills ENT: No upper  respiratory complaints. Cardiovascular: No chest pain. Respiratory:  No SOB. Gastrointestinal: No abdominal pain.  No nausea, no vomiting.  Musculoskeletal: Positive for knee pain. Skin: Negative for rash, abrasions, lacerations, ecchymosis. Neurological: Negative for headaches, numbness or tingling   ____________________________________________   PHYSICAL EXAM:  VITAL SIGNS: ED Triage Vitals  Enc Vitals Group     BP 05/30/16 0731 130/88     Pulse Rate 05/30/16 0730 (!) 101     Resp 05/30/16 0730 20     Temp 05/30/16 0730 98.3 F (36.8 C)     Temp Source 05/30/16 0730 Oral     SpO2 05/30/16 0730 100 %     Weight 05/30/16 0730 219 lb (99.3 kg)     Height 05/30/16 0730  (1.676 m)     Head Circumference --      Peak Flow --      Pain Score 05/30/16 0730 6     Pain Loc --      Pain Edu? --      Excl. in GC? --      Constitutional: Alert and oriented. Well appearing and in no acute distress. Eyes: Conjunctivae are normal. PERRL. EOMI. Head: Atraumatic. ENT:      Ears:      Nose: No congestion/rhinnorhea.      Mouth/Throat: Mucous membranes are moist.  Neck: No stridor.   Cardiovascular: Normal rate, regular rhythm.  Good peripheral circulation. Respiratory: Normal respiratory effort  without tachypnea or retractions. Lungs CTAB. Good air entry to the bases with no decreased or absent breath sounds. Gastrointestinal: Bowel sounds 4 quadrants. Soft and nontender to palpation. No guarding or rigidity. No palpable masses. No distention. Musculoskeletal: Full range of motion to all extremities. No gross deformities appreciated. Tenderness to palpation in the popliteal fossa. No tenderness to palpation over patella. No erythema or swelling. No calf pain. Neurologic:  Normal speech and language. No gross focal neurologic deficits are appreciated.  Skin:  Skin is warm, dry and intact. No rash noted.   ____________________________________________   LABS (all labs ordered  are listed, but only abnormal results are displayed)  Labs Reviewed - No data to display ____________________________________________  EKG   ____________________________________________  RADIOLOGY Lexine Baton, personally viewed and evaluated these images (plain radiographs) as part of my medical decision making, as well as reviewing the written report by the radiologist.  US Venous Img Lower Bilateral  Result Date: 05/30/2016 CLINICAL DATA:  Right lower extremity pain for 2 weeks. EXAM: BILATERAL LOWER EXTREMITY VENOUS DOPPLER ULTRASOUND TECHNIQUE: Gray-scale sonography with graded compression, as well as color Doppler and duplex ultrasound were performed to evaluate the lower extremity deep venous systems from the level of the common femoral vein and including the common femoral, femoral, profunda femoral, popliteal and calf veins including the posterior tibial, peroneal and gastrocnemius veins when visible. The superficial great saphenous vein was also interrogated. Spectral Doppler was utilized to evaluate flow at rest and with distal augmentation maneuvers in the common femoral, femoral and popliteal veins. COMPARISON:  Ultrasound of January 22, 2013. FINDINGS: RIGHT LOWER EXTREMITY Common Femoral Vein: No evidence of thrombus. Normal compressibility, respiratory phasicity and response to augmentation. Saphenofemoral Junction: No evidence of thrombus. Normal compressibility and flow on color Doppler imaging. Profunda Femoral Vein: No evidence of thrombus. Normal compressibility and flow on color Doppler imaging. Femoral Vein: No evidence of thrombus. Normal compressibility, respiratory phasicity and response to augmentation. Popliteal Vein: No evidence of thrombus. Normal compressibility, respiratory phasicity and response to augmentation. Calf Veins: No evidence of thrombus. Normal compressibility and flow on color Doppler imaging. Superficial Great Saphenous Vein: No evidence of thrombus.  Normal compressibility and flow on color Doppler imaging. Venous Reflux:  None. Other Findings:  None. LEFT LOWER EXTREMITY Common Femoral Vein: No evidence of thrombus. Normal compressibility, respiratory phasicity and response to augmentation. Saphenofemoral Junction: No evidence of thrombus. Normal compressibility and flow on color Doppler imaging. Profunda Femoral Vein: No evidence of thrombus. Normal compressibility and flow on color Doppler imaging. Femoral Vein: No evidence of thrombus. Normal compressibility, respiratory phasicity and response to augmentation. Popliteal Vein: No evidence of thrombus. Normal compressibility, respiratory phasicity and response to augmentation. Calf Veins: No evidence of thrombus. Normal compressibility and flow on color Doppler imaging. Superficial Great Saphenous Vein: No evidence of thrombus. Normal compressibility and flow on color Doppler imaging. Venous Reflux:  None. Other Findings:  None. IMPRESSION: No evidence of deep venous thrombosis seen in either lower extremity. Electronically Signed   By: Lupita Raider, M.D.   On: 05/30/2016 09:45   Dg Knee Complete 4 Views Left  Result Date: 05/30/2016 CLINICAL DATA:  Left knee swelling and pain. EXAM: LEFT KNEE - COMPLETE 4+ VIEW COMPARISON:  None. FINDINGS: No evidence of fracture or dislocation. Question small knee joint effusion on lateral projection. Mild medial compartment degenerative spurring and joint space narrowing is seen, consistent with osteoarthritis. No other osseous abnormality identified. IMPRESSION: Mild medial compartment osteoarthritis. Question  small knee joint effusion. Electronically Signed   By: Myles Rosenthal M.D.   On: 05/30/2016 08:58   Dg Knee Complete 4 Views Right  Result Date: 05/30/2016 CLINICAL DATA:  Right knee pain and swelling. EXAM: RIGHT KNEE - COMPLETE 4+ VIEW COMPARISON:  None. FINDINGS: No evidence of fracture or dislocation. Small knee joint effusion noted. Mild degenerative  spurring and joint space narrowing seen involving the medial compartment. No other osseous abnormality identified. Soft tissues are unremarkable. IMPRESSION: Mild medial compartment osteoarthritis and small knee joint effusion. Electronically Signed   By: Myles Rosenthal M.D.   On: 05/30/2016 08:56    ____________________________________________    PROCEDURES  Procedure(s) performed:    Procedures    Medications  acetaminophen (TYLENOL) tablet 650 mg (650 mg Oral Given 05/30/16 0934)  ketorolac (TORADOL) injection 30 mg (30 mg Intramuscular Given 05/30/16 1033)     ____________________________________________   INITIAL IMPRESSION / ASSESSMENT AND PLAN / ED COURSE  Pertinent labs & imaging results that were available during my care of the patient were reviewed by me and considered in my medical decision making (see chart for details).  Review of the [place] CSRS was performed in accordance of the [facility] prior to dispensing any controlled drugs.     Patient's diagnosis is consistent with mild osteoarthritis and minimal joint effusion. Vital signs and exam are reassuring. No evidence of DVT on ultrasound. X-ray consistent with osteoarthritis and joint effusion. Patient will be discharged home with prescriptions for a short course of tramadol. Patient is to follow up with orthopedics as directed. Patient is given ED precautions to return to the ED for any worsening or new symptoms.     ____________________________________________  FINAL CLINICAL IMPRESSION(S) / ED DIAGNOSES  Final diagnoses:  Osteoarthritis of both knees, unspecified osteoarthritis type  Joint effusion      NEW MEDICATIONS STARTED DURING THIS VISIT:  New Prescriptions   TRAMADOL (ULTRAM) 50 MG TABLET    Take 1 tablet (50 mg total) by mouth every 6 (six) hours as needed.        This chart was dictated using voice recognition software/Dragon. Despite best efforts to proofread, errors can occur which can change  the meaning. Any change was purely unintentional.    Enid Derry, PA-C 05/30/16 1057    Rockne Menghini, MD 05/30/16 1105

## 2016-05-30 NOTE — ED Triage Notes (Signed)
Pt reports bilt knee swelling and pain for over a week. No known injury.

## 2016-05-30 NOTE — ED Notes (Signed)
See triage note  States she is having pain to both knees with some swelling  Denies any specific injury hx of DVT to left knee and leg  States pain is lateral and posterior to both knees ambulates with sl limp d/t pain  No redness or warmth noted to either knee

## 2016-06-08 DIAGNOSIS — M1711 Unilateral primary osteoarthritis, right knee: Secondary | ICD-10-CM | POA: Diagnosis not present

## 2016-06-08 DIAGNOSIS — M170 Bilateral primary osteoarthritis of knee: Secondary | ICD-10-CM | POA: Diagnosis not present

## 2016-06-16 ENCOUNTER — Telehealth (INDEPENDENT_AMBULATORY_CARE_PROVIDER_SITE_OTHER): Payer: Self-pay | Admitting: Vascular Surgery

## 2016-06-16 ENCOUNTER — Other Ambulatory Visit (INDEPENDENT_AMBULATORY_CARE_PROVIDER_SITE_OTHER): Payer: Self-pay

## 2016-06-16 NOTE — Telephone Encounter (Signed)
Patient requested that her medications be called into [facility] pharmacy because of cost. I called in the refills to the pharmacy of her choice today and notified her to pick them up there later.

## 2016-06-16 NOTE — Telephone Encounter (Signed)
Patient needs refill on Eliquis called in to [facility].

## 2016-10-19 DIAGNOSIS — J019 Acute sinusitis, unspecified: Secondary | ICD-10-CM | POA: Diagnosis not present

## 2016-10-19 DIAGNOSIS — R05 Cough: Secondary | ICD-10-CM | POA: Diagnosis not present

## 2017-02-14 DIAGNOSIS — Z79899 Other long term (current) drug therapy: Secondary | ICD-10-CM | POA: Diagnosis not present

## 2017-03-28 ENCOUNTER — Other Ambulatory Visit (INDEPENDENT_AMBULATORY_CARE_PROVIDER_SITE_OTHER): Payer: Self-pay | Admitting: Vascular Surgery

## 2017-04-09 ENCOUNTER — Telehealth (INDEPENDENT_AMBULATORY_CARE_PROVIDER_SITE_OTHER): Payer: Self-pay | Admitting: Vascular Surgery

## 2017-04-09 NOTE — Telephone Encounter (Signed)
Left VM for patient to call and let us to see if she is being followed by someone for her DVT, if not she needs appt.

## 2017-04-13 ENCOUNTER — Telehealth (INDEPENDENT_AMBULATORY_CARE_PROVIDER_SITE_OTHER): Payer: Self-pay | Admitting: Vascular Surgery

## 2017-04-26 ENCOUNTER — Telehealth (INDEPENDENT_AMBULATORY_CARE_PROVIDER_SITE_OTHER): Payer: Self-pay | Admitting: Vascular Surgery

## 2017-04-26 ENCOUNTER — Other Ambulatory Visit (INDEPENDENT_AMBULATORY_CARE_PROVIDER_SITE_OTHER): Payer: Self-pay

## 2017-04-26 MED ORDER — APIXABAN 5 MG PO TABS: 5.0000 mg | ORAL_TABLET | Freq: Two times a day (BID) | ORAL | 5 refills | Status: DC

## 2017-04-26 NOTE — Telephone Encounter (Signed)
Patient wants a refill on her Eliquis. [facility] [place].

## 2017-04-26 NOTE — Telephone Encounter (Signed)
Eliquis 5mg  was sent to [facility] through for the patient request the system and I called the patient to inform her that her medication had been sent

## 2017-05-03 ENCOUNTER — Ambulatory Visit (INDEPENDENT_AMBULATORY_CARE_PROVIDER_SITE_OTHER): Payer: 59 | Admitting: Vascular Surgery

## 2017-05-17 ENCOUNTER — Ambulatory Visit (INDEPENDENT_AMBULATORY_CARE_PROVIDER_SITE_OTHER): Payer: 59 | Admitting: Vascular Surgery

## 2017-05-17 ENCOUNTER — Encounter (INDEPENDENT_AMBULATORY_CARE_PROVIDER_SITE_OTHER): Payer: Self-pay | Admitting: Vascular Surgery

## 2017-05-17 DIAGNOSIS — Z86718 Personal history of other venous thrombosis and embolism: Secondary | ICD-10-CM | POA: Diagnosis not present

## 2017-05-17 DIAGNOSIS — M79606 Pain in leg, unspecified: Secondary | ICD-10-CM | POA: Insufficient documentation

## 2017-05-17 DIAGNOSIS — M79605 Pain in left leg: Secondary | ICD-10-CM | POA: Diagnosis not present

## 2017-05-17 DIAGNOSIS — M7989 Other specified soft tissue disorders: Secondary | ICD-10-CM

## 2017-05-17 MED ORDER — APIXABAN 5 MG PO TABS: 5.0000 mg | ORAL_TABLET | Freq: Two times a day (BID) | ORAL | 5 refills | Status: DC

## 2017-05-17 NOTE — Progress Notes (Signed)
MRN : [id number]  Colleen Campos is a 47 y.o. (03/19/1970) female who presents with chief complaint of  Chief Complaint  Patient presents with  . Follow-up    Leg swelling and pain  .  History of Present Illness:    The patient presents to the office for evaluation of DVT.  DVT was [facility] Duplex [facility].  The initial symptoms were pain and swelling in the lower extremity.  The patient notes the leg continues to be very painful with dependency and swells quite a bite.  Symptoms are much better with elevation.  The patient notes minimal edema in the morning which steadily worsens throughout the day.    The patient has not been using compression therapy at this point.  No SOB or pleuritic chest pains.  No cough or hemoptysis.  No blood per rectum or blood in any sputum.  No excessive bruising per the patient.       Current Meds  Medication Sig  . ALPRAZolam (XANAX) 1 MG tablet Take 1 mg by mouth 3 (three) times daily as needed for anxiety.  [name]. apixaban (ELIQUIS) 5 MG TABS tablet Take 1 tablet (5 mg total) by mouth 2 (two) times daily.  . divalproex (DEPAKOTE ER) 500 MG 24 hr tablet   . QUEtiapine (SEROQUEL) 300 MG tablet Take 300 mg by mouth at bedtime.  . [DISCONTINUED] apixaban (ELIQUIS) 5 MG TABS tablet Take 1 tablet (5 mg total) by mouth 2 (two) times daily.    Past Medical History:  Diagnosis Date  . Anxiety   . Bipolar 1 disorder (HCC)   . History of blood clots   . PTSD (post-traumatic stress disorder)     History reviewed. No pertinent surgical history.  Social History Social History   Tobacco Use  . Smoking status: Current Every Day Smoker  . Smokeless tobacco: Never Used  Substance Use Topics  . Alcohol use: Yes  . Drug use: No    Family History History reviewed. No pertinent family history.  Allergies  Allergen Reactions  . Tramadol     Other reaction(s): Headache     REVIEW OF SYSTEMS (Negative unless  checked)  Constitutional: [] Weight loss  [] Fever  [] Chills Cardiac: [] Chest pain   [] Chest pressure   [] Palpitations   [] Shortness of breath when laying flat   [] Shortness of breath with exertion. Vascular:  [] Pain in legs with walking   [x] Pain in legs with standing[x] History of DVT   [] Phlebitis   [x] Swelling in legs   [] Varicose veins   [] Non-healing ulcers Pulmonary:   [] Uses home oxygen   [] Productive cough   [] Hemoptysis   [] Wheeze  [] COPD   [] Asthma Neurologic:  [] Dizziness   [] Seizures   [] History of stroke   [] History of TIA  [] Aphasia   [] Vissual changes   [] Weakness or numbness in arm   [] Weakness or numbness in leg Musculoskeletal:   [] Joint swelling   [] Joint pain   [] Low back pain Hematologic:  [] Easy bruising  [] Easy bleeding   [] Hypercoagulable state   [] Anemic Gastrointestinal:  [] Diarrhea   [] Vomiting  [] Gastroesophageal reflux/heartburn   [] Difficulty swallowing. Genitourinary:  [] Chronic kidney disease   [] Difficult urination  [] Frequent urination   [] Blood in urine Skin:  [] Rashes   [] Ulcers  Psychological:  [] History of anxiety   []  History of major depression.  Physical Examination  Vitals:   05/17/17 0834  BP: (!) 146/97  Pulse: 75  Resp: 16  Weight: 227 lb (103 kg)  Height: 5' 7.5" (1.715 m)   Body mass index is 35.03 kg/m. Gen: WD/WN, NAD Head: [place]/AT, No temporalis wasting.  Ear/Nose/Throat: Hearing grossly intact, nares w/o erythema or drainage Eyes: PER, EOMI, sclera nonicteric.  Neck: Supple, no large masses.   Pulmonary:  Good air movement, no audible wheezing bilaterally, no use of accessory muscles.  Cardiac: RRR, no JVD Vascular: scattered varicosities present bilaterally.  Mild venous stasis changes to the legs bilaterally.  3+ soft pitting edema left leg Vessel Right Left  Radial Palpable Palpable  PT Palpable Palpable  DP Palpable Palpable  Gastrointestinal: Non-distended. No guarding/no peritoneal signs.  Musculoskeletal: M/S 5/5 throughout.   No deformity or atrophy.  Neurologic: CN 2-12 intact. Symmetrical.  Speech is fluent. Motor exam as listed above. Psychiatric: Judgment intact, Mood & affect appropriate for pt's clinical situation. Dermatologic: mild venous rashes no ulcers noted.  No changes consistent with cellulitis. Lymph : No lichenification or skin changes of chronic lymphedema.  CBC Lab Results  Component Value Date   WBC 5.3 07/29/2013   HGB 15.4 07/29/2013   HCT 45.8 07/29/2013   MCV 88 07/29/2013   PLT 150 07/29/2013    BMET    Component Value Date/Time   NA 136 01/22/2013 0636   K 3.9 01/22/2013 0636   CL 107 01/22/2013 0636   CO2 22 01/22/2013 0636   GLUCOSE 141 (H) 01/22/2013 0636   BUN 7 01/22/2013 0636   CREATININE 0.80 01/27/2013 1314   CALCIUM 8.2 (L) 01/22/2013 0636   GFRNONAA >60 01/27/2013 1314   GFRAA >60 01/27/2013 1314   CrCl cannot be calculated (Patient's most recent lab result is older than the maximum 21 days allowed.).  COAG Lab Results  Component Value Date   INR 0.9 01/22/2013    Radiology No results found.    Assessment/Plan 1. History of DVT (deep vein thrombosis) Recommend:   No surgery or intervention at this point in time.  IVC filter is not indicated at present as she seems to be tolerating her anticoagulation.  Patient should have a duplex ultrasound to evaluate her increased leg pain.  The patient continues on anticoagulation   Elevation was stressed, use of a recliner was discussed.  I have had a long discussion with the patient regarding DVT and post phlebitic changes such as swelling and why it  causes symptoms such as pain.  The patient will wear graduated compression stockings class 1 (20-30 mmHg), beginning after three full days of anticoagulation, on a daily basis a prescription was given. The patient will  beginning wearing the stockings first thing in the morning and removing them in the evening. The patient is instructed specifically not to  sleep in the stockings.  In addition, behavioral modification including elevation during the day and avoidance of prolonged dependency will be initiated.    The patient will continue anticoagulation for now as there have not been any problems or complications at this point.    2. Pain of left lower extremity Given the increased pain I will order a venous duplex  - VAS [place] LOWER EXTREMITY VENOUS (DVT); Future  3. Leg swelling No surgery or intervention at this point in time.    I have reviewed my discussion with the patient regarding venous insufficiency and secondary lymph edema and why it  causes symptoms. I have discussed with the patient the chronic skin changes that accompany these problems and the long term sequela such as ulceration and infection.  Patient will continue wearing graduated  compression stockings class 1 (20-30 mmHg) on a daily basis a prescription was given to the patient to keep this updated. The patient will  put the stockings on first thing in the morning and removing them in the evening. The patient is instructed specifically not to sleep in the stockings.  In addition, behavioral modification including elevation during the day will be continued.  Diet and salt restriction was also discussed.  The patient will follow up in 12 months to reassess the degree of swelling and the control that graduated compression is offering.   The patient can be assessed for a Lymph Pump at that time.  However, at this time the patient states they are satisfied with the control compression and elevation is yielding.      Levora Dredge, MD  05/17/2017 8:29 PM

## 2017-05-21 ENCOUNTER — Telehealth (INDEPENDENT_AMBULATORY_CARE_PROVIDER_SITE_OTHER): Payer: Self-pay | Admitting: Vascular Surgery

## 2017-05-21 NOTE — Telephone Encounter (Signed)
I called the patient to inform her to call her pharmacy and have them fax a prior authorization to the office for her Eliquis

## 2017-05-21 NOTE — Telephone Encounter (Signed)
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  The doctors will have to write all prescriptions for Eliquis.  If a prior authorization is needed / peer to peer - unfortunately the doctors will have to do this going forward.

## 2017-06-07 ENCOUNTER — Telehealth (INDEPENDENT_AMBULATORY_CARE_PROVIDER_SITE_OTHER): Payer: Self-pay | Admitting: Vascular Surgery

## 2017-06-07 NOTE — Telephone Encounter (Signed)
Patient said she is having swelling in her left leg and that she keeps legs elevated when she can. She wants a doctors note for work so she can be out a couple of day to elevate her legs more.

## 2017-06-07 NOTE — Telephone Encounter (Signed)
I spoke with GS and advise at this time we will not be able to take the patient out of work for leg swelling but she should wear her compressions stockings and elevated legs as much possible to help with her swelling.

## 2017-07-16 DIAGNOSIS — Z79899 Other long term (current) drug therapy: Secondary | ICD-10-CM | POA: Diagnosis not present

## 2017-11-12 DIAGNOSIS — Z79899 Other long term (current) drug therapy: Secondary | ICD-10-CM | POA: Diagnosis not present

## 2018-02-16 ENCOUNTER — Emergency Department: Payer: Managed Care, Other (non HMO)

## 2018-02-16 ENCOUNTER — Encounter: Payer: Self-pay | Admitting: Emergency Medicine

## 2018-02-16 ENCOUNTER — Emergency Department
Admission: EM | Admit: 2018-02-16 | Discharge: 2018-02-17 | Disposition: A | Discharge status: Home / Self Care | Payer: Managed Care, Other (non HMO) | Attending: Emergency Medicine | Admitting: Emergency Medicine

## 2018-02-16 ENCOUNTER — Other Ambulatory Visit: Payer: Self-pay

## 2018-02-16 DIAGNOSIS — R2231 Localized swelling, mass and lump, right upper limb: Secondary | ICD-10-CM | POA: Insufficient documentation

## 2018-02-16 DIAGNOSIS — Z7901 Long term (current) use of anticoagulants: Secondary | ICD-10-CM | POA: Diagnosis not present

## 2018-02-16 DIAGNOSIS — Z79899 Other long term (current) drug therapy: Secondary | ICD-10-CM | POA: Diagnosis not present

## 2018-02-16 DIAGNOSIS — F17210 Nicotine dependence, cigarettes, uncomplicated: Secondary | ICD-10-CM | POA: Diagnosis not present

## 2018-02-16 DIAGNOSIS — R609 Edema, unspecified: Secondary | ICD-10-CM

## 2018-02-16 DIAGNOSIS — M79601 Pain in right arm: Secondary | ICD-10-CM | POA: Diagnosis not present

## 2018-02-16 DIAGNOSIS — I808 Phlebitis and thrombophlebitis of other sites: Secondary | ICD-10-CM

## 2018-02-16 NOTE — ED Provider Notes (Signed)
[facility] Emergency Department Provider Note   ____________________________________________    I have reviewed the triage vital signs and the nursing notes.   HISTORY  Chief Complaint Arm Pain     HPI Colleen Campos is a 48 y.o. female who presents with complaints of right arm pain.  Patient reports approximately 24 hours of right arm pain and swelling from approximately the mid forearm to just above the elbow.  She describes an area of redness there as well.  Denies skin break or IV drug abuse or injury to the area.  She does have a significant history of DVTs in the past, she does take Eliquis although admits to missing doses because of the cost.  Denies fevers or chills.  No history of skin infections.  No history of diabetes.  No shortness of breath or pleurisy   Past Medical History:  Diagnosis Date  . Anxiety   . Bipolar 1 disorder (HCC)   . History of blood clots   . PTSD (post-traumatic stress disorder)     Patient Active Problem List   Diagnosis Date Noted  . History of DVT (deep vein thrombosis) 05/17/2017  . Leg pain 05/17/2017  . Leg swelling 05/17/2017    Past Surgical History:  Procedure Laterality Date  . CESAREAN SECTION    . DILATION AND CURETTAGE OF UTERUS      Prior to Admission medications   Medication Sig Start Date End Date Taking? Authorizing Provider  apixaban (ELIQUIS) 5 MG TABS tablet Take 1 tablet (5 mg total) by mouth 2 (two) times daily. 05/17/17  Yes Schnier, Latina CraverGregory G, MD  divalproex (DEPAKOTE ER) 500 MG 24 hr tablet  04/26/17  Yes [provider]  QUEtiapine (SEROQUEL) 300 MG tablet Take 300 mg by mouth at bedtime.   Yes [provider]  ALPRAZolam Prudy Feeler(XANAX) 1 MG tablet Take 1 mg by mouth 4 (four) times daily as needed for anxiety.     [provider]     Allergies Tramadol and Ativan [lorazepam]  History reviewed. No pertinent family history.  Social History Social History     Tobacco Use  . Smoking status: Current Every Day Smoker    Packs/day: 1.00    Types: Cigarettes  . Smokeless tobacco: Never Used  Substance Use Topics  . Alcohol use: Yes  . Drug use: Yes    Types: Marijuana    Comment: within the last month    Review of Systems  Constitutional: No fever/chills Eyes: No visual changes.  ENT: No sore throat. Cardiovascular: Denies chest pain. Respiratory: Denies shortness of breath. Gastrointestinal: No abdominal pain.  No nausea, no vomiting.   Genitourinary: Negative for dysuria. Musculoskeletal: Negative for back pain. Skin: Negative for rash. Neurological: Negative for headaches or weakness   ____________________________________________   PHYSICAL EXAM:  VITAL SIGNS: ED Triage Vitals  Enc Vitals Group     BP 02/16/18 2159 (!) 144/98     Pulse Rate 02/16/18 2159 (!) 111     Resp 02/16/18 2159 17     Temp 02/16/18 2159 97.8 F (36.6 C)     Temp Source 02/16/18 2159 Oral     SpO2 02/16/18 2159 98 %     Weight 02/16/18 2201 106.6 kg (235 lb)     Height 02/16/18 2201 1.702 m (5\' 7" )     Head Circumference --      Peak Flow --      Pain Score 02/16/18 2200 8  Pain Loc --      Pain Edu? --      Excl. in GC? --     Constitutional: Alert and oriented. No acute distress.    Mouth/Throat: Mucous membranes are moist.    Cardiovascular: Normal rate, regular rhythm. Grossly normal heart sounds.  Good peripheral circulation. Respiratory: Normal respiratory effort.  No retractions. Lungs CTAB.    Musculoskeletal:   Warm and well perfused Neurologic:  Normal speech and language. No gross focal neurologic deficits are appreciated.  Skin:  Skin is warm, dry and intact.  Erythema to the right primarily medial forearm extending just above the joint, full range of motion of the elbow without pain, mild swelling Psychiatric: Mood and affect are normal. Speech and behavior are normal.  ____________________________________________    LABS (all labs ordered are listed, but only abnormal results are displayed)  Labs Reviewed - No data to display ____________________________________________  EKG  None ____________________________________________  RADIOLOGY  Ultrasound right upper extremity ____________________________________________   PROCEDURES  Procedure(s) performed: No  Procedures   Critical Care performed: No ____________________________________________   INITIAL IMPRESSION / ASSESSMENT AND PLAN / ED COURSE  Pertinent labs & imaging results that were available during my care of the patient were reviewed by me and considered in my medical decision making (see chart for details).  Presentation is most consistent with cellulitis although DVT is certainly a possibility.  Pending ultrasound.  Patient is afebrile.  I have asked Dr. Dolores FrameSung to follow-up on ultrasound results and if negative anticipate d/c with abx    ____________________________________________   FINAL CLINICAL IMPRESSION(S) / ED DIAGNOSES  Final diagnoses:  Swelling  Right arm pain        Note:  This document was prepared using Dragon voice recognition software and may include unintentional dictation errors.   Jene EveryKinner, Owenn Rothermel, MD 02/16/18 2250

## 2018-02-16 NOTE — ED Triage Notes (Addendum)
Pt reports pain and swelling to right arm from mid forearm to mid bicep area; started Wednesday and is getting worse;  redness present; some warmth; pt reports history of DVT to left leg and PE; takes Eliquis daily; denies SOB

## 2018-02-16 NOTE — ED Notes (Signed)
Patient transported to Ultrasound 

## 2018-02-16 NOTE — ED Notes (Addendum)
Dr Cyril Loosen at bedside to assess pt; pt admits to him that she has missed some doses of Eliquis

## 2018-02-16 NOTE — ED Notes (Signed)
Pt updated on ultrasound process.

## 2018-02-16 NOTE — ED Notes (Signed)
Pt returned from ultrasound

## 2018-02-17 MED ORDER — IBUPROFEN 800 MG PO TABS
800.0000 mg | ORAL_TABLET | Freq: Once | ORAL | Status: AC
  Administered 2018-02-17: 800 mg via ORAL
  Filled 2018-02-17: qty 1

## 2018-02-17 MED ORDER — IBUPROFEN 800 MG PO TABS: 800.0000 mg | ORAL_TABLET | Freq: Three times a day (TID) | ORAL | 0 refills | Status: DC | PRN

## 2018-02-17 MED ORDER — HYDROCODONE-ACETAMINOPHEN 5-325 MG PO TABS: 1.0000 | ORAL_TABLET | Freq: Four times a day (QID) | ORAL | 0 refills | Status: DC | PRN

## 2018-02-17 MED ORDER — HYDROCODONE-ACETAMINOPHEN 5-325 MG PO TABS
1.0000 | ORAL_TABLET | Freq: Once | ORAL | Status: AC
  Administered 2018-02-17: 1 via ORAL
  Filled 2018-02-17: qty 1

## 2018-02-17 NOTE — Discharge Instructions (Addendum)
1.  You may take pain medicines as needed  (Motrin/Norco #15). 2.  Please make an appointment to see your vascular surgeon.  You may need a repeat ultrasound in 7 to 10 days. 3.  Return to the ER for worsening symptoms, persistent vomiting, difficulty breathing or other concerns.

## 2018-02-17 NOTE — ED Provider Notes (Signed)
-----------------------------------------   12:14 AM on 02/17/2018 -----------------------------------------  ED interpretation: Superficial thrombophlebitis  RUE ultrasound interpreted per Dr. Gwenyth Bender: 1. No evidence of DVT within the right upper extremity.  2. Segmental area of thrombosis of the distal basilic vein at the  antecubital fossa.   Updated patient of ultrasound result.  Sees Dr. [name] from vascular surgery.  Admits she has been out of Eliquis for the past several weeks due to cost.  Will start NSAIDs and patient will call the office on Monday to schedule a follow-up appointment.  I did advise her to have a repeat ultrasound in 7 to 10 days to evaluate interval development of DVT.  She will speak with her doctor to get either samples or start a different anticoagulant.  Strict return precautions given.  Patient verbalizes understanding and agrees with plan of care.   Irean Hong, MD 02/17/18 [phone]

## 2018-02-18 ENCOUNTER — Other Ambulatory Visit (INDEPENDENT_AMBULATORY_CARE_PROVIDER_SITE_OTHER): Payer: Self-pay | Admitting: Nurse Practitioner

## 2018-02-18 ENCOUNTER — Telehealth (INDEPENDENT_AMBULATORY_CARE_PROVIDER_SITE_OTHER): Payer: Self-pay | Admitting: Vascular Surgery

## 2018-02-18 ENCOUNTER — Telehealth (INDEPENDENT_AMBULATORY_CARE_PROVIDER_SITE_OTHER): Payer: Self-pay

## 2018-02-18 ENCOUNTER — Telehealth (INDEPENDENT_AMBULATORY_CARE_PROVIDER_SITE_OTHER): Payer: Self-pay | Admitting: Nurse Practitioner

## 2018-02-18 DIAGNOSIS — Z86718 Personal history of other venous thrombosis and embolism: Secondary | ICD-10-CM

## 2018-02-18 MED ORDER — RIVAROXABAN (XARELTO) VTE STARTER PACK (15 & 20 MG): ORAL_TABLET | ORAL | 0 refills | Status: DC

## 2018-02-18 NOTE — Telephone Encounter (Signed)
Spoke with the patient and gave her the recommendations from Sheppard Plumber NP. Patient stated she would contact her pharmacy and let us know.      Advise the patient to contact her pharmacy and see if xarelto will be less expensive.if so, we can write a prescription for that.  If not, we can do coumadin, which is generally inexpensive, although it will take more monitoring of blood work

## 2018-02-18 NOTE — Telephone Encounter (Signed)
Please advise. See note below. 

## 2018-02-18 NOTE — Telephone Encounter (Signed)
Advise the patient to contact her pharmacy and see if xarelto will be less expensive.if so, we can write a prescription for that.  If not, we can do coumadin, which is generally inexpensive, although it will take more monitoring of blood work.

## 2018-02-18 NOTE — Telephone Encounter (Signed)
Spoke with the patient and gave her Fallon Brown NP recommendations. See notes below. 

## 2018-02-19 ENCOUNTER — Telehealth (INDEPENDENT_AMBULATORY_CARE_PROVIDER_SITE_OTHER): Payer: Self-pay | Admitting: Nurse Practitioner

## 2018-02-19 NOTE — Telephone Encounter (Signed)
See note below

## 2018-02-19 NOTE — Telephone Encounter (Signed)
Spoke with the patient and gave her Sheppard Plumber NP recommendations. See notes below.

## 2018-02-19 NOTE — Telephone Encounter (Signed)
I have received pre-authorization approval, we have re-faxed the prescription and she should attempt to contact them later today to see about receiving the medication.  If it is still too expensive, she should let us know so that we can begin her on Coumadin.

## 2018-02-22 ENCOUNTER — Telehealth (INDEPENDENT_AMBULATORY_CARE_PROVIDER_SITE_OTHER): Payer: Self-pay | Admitting: Nurse Practitioner

## 2018-02-22 NOTE — Telephone Encounter (Signed)
Pt called office because she states she is still not able to pick up her rx from the pharmacy. According to phone note on 02/19/18 Colleen Campos received approval. Called pharmacy and spoke with pharmacist who states they are still showing on there end that a prior Berkley Harvey is needed. Colleen Campos is looking into checking for auth #.

## 2018-02-22 NOTE — Telephone Encounter (Signed)
I have received pre-approval again. Please call them with the appropriate information, as well as fax it to them as well. Thanks.

## 2018-02-22 NOTE — Telephone Encounter (Signed)
Called and spoke with prior auth department and on their end they show both prior auth that were done were approved. She provided me with two case #s for both prior auths 1) [id number] approval Dates 01/30/18-08/21/18 2) [id number] approval dates 01/20/2018-02/19/2019.   Called pharmacy again and they re-ran and it went through but they saw it would cost the pt $553.62 out of pocket.  Called and spoke with pt and she wrote down amount and she will call us back if she is able to afford medication.   Spoke with Vivia Birmingham in office who state if pt is unable to afford the next option is Coumadin. Then she will need to follow up with blood work in one week to check INR level. Pt is to avoid green leafy vegatables as this can alter results. Also to inform pt that it may take several blood draws to assure that we have her in correct range. Will await pt's call back

## 2018-02-25 ENCOUNTER — Telehealth (INDEPENDENT_AMBULATORY_CARE_PROVIDER_SITE_OTHER): Payer: Self-pay | Admitting: Nurse Practitioner

## 2018-02-25 ENCOUNTER — Other Ambulatory Visit (INDEPENDENT_AMBULATORY_CARE_PROVIDER_SITE_OTHER): Payer: Self-pay | Admitting: Nurse Practitioner

## 2018-02-25 DIAGNOSIS — Z86718 Personal history of other venous thrombosis and embolism: Secondary | ICD-10-CM

## 2018-02-25 NOTE — Telephone Encounter (Signed)
Please advise 

## 2018-02-25 NOTE — Telephone Encounter (Signed)
I spoke with the patient directly.  She will go and get a baseline INR and we will start coumadin.  Discussed with patient the contraindications while on coumadin

## 2018-02-26 ENCOUNTER — Other Ambulatory Visit (INDEPENDENT_AMBULATORY_CARE_PROVIDER_SITE_OTHER): Payer: Self-pay | Admitting: Nurse Practitioner

## 2018-02-26 ENCOUNTER — Other Ambulatory Visit
Admission: RE | Admit: 2018-02-26 | Discharge: 2018-02-26 | Disposition: A | Discharge status: Home / Self Care | Payer: Managed Care, Other (non HMO) | Attending: Nurse Practitioner | Admitting: Nurse Practitioner

## 2018-02-26 DIAGNOSIS — Z86718 Personal history of other venous thrombosis and embolism: Secondary | ICD-10-CM

## 2018-02-26 LAB — PROTIME-INR
INR: 0.92
Prothrombin Time: 12.3 seconds (ref 11.4–15.2)

## 2018-02-26 MED ORDER — WARFARIN SODIUM 2.5 MG PO TABS: 5.0000 mg | ORAL_TABLET | Freq: Every day | ORAL | 11 refills | Status: DC

## 2018-02-26 NOTE — Telephone Encounter (Signed)
Pt is aware and has no questions or concerns at this time  

## 2018-03-01 ENCOUNTER — Other Ambulatory Visit (INDEPENDENT_AMBULATORY_CARE_PROVIDER_SITE_OTHER): Payer: Self-pay | Admitting: Nurse Practitioner

## 2018-03-01 ENCOUNTER — Telehealth (INDEPENDENT_AMBULATORY_CARE_PROVIDER_SITE_OTHER): Payer: Self-pay | Admitting: Nurse Practitioner

## 2018-03-01 ENCOUNTER — Other Ambulatory Visit
Admission: RE | Admit: 2018-03-01 | Discharge: 2018-03-01 | Disposition: A | Discharge status: Home / Self Care | Payer: Managed Care, Other (non HMO) | Source: Ambulatory Visit | Attending: Nurse Practitioner | Admitting: Nurse Practitioner

## 2018-03-01 DIAGNOSIS — Z86718 Personal history of other venous thrombosis and embolism: Secondary | ICD-10-CM

## 2018-03-01 LAB — PROTIME-INR
INR: 2.29
Prothrombin Time: 24.9 seconds — ABNORMAL HIGH (ref 11.4–15.2)

## 2018-03-01 NOTE — Telephone Encounter (Signed)
Spoke with pt and made her aware of Fallon's message. Pt had no additional questions at this time

## 2018-03-01 NOTE — Telephone Encounter (Signed)
Orders placed and faxed

## 2018-03-05 ENCOUNTER — Other Ambulatory Visit
Admission: RE | Admit: 2018-03-05 | Discharge: 2018-03-05 | Disposition: A | Discharge status: Home / Self Care | Payer: Managed Care, Other (non HMO) | Attending: Nurse Practitioner | Admitting: Nurse Practitioner

## 2018-03-05 ENCOUNTER — Telehealth (INDEPENDENT_AMBULATORY_CARE_PROVIDER_SITE_OTHER): Payer: Self-pay | Admitting: Nurse Practitioner

## 2018-03-05 DIAGNOSIS — Z86718 Personal history of other venous thrombosis and embolism: Secondary | ICD-10-CM | POA: Insufficient documentation

## 2018-03-05 LAB — PROTIME-INR
INR: 3.5
PROTHROMBIN TIME: 34.6 s — AB (ref 11.4–15.2)

## 2018-03-05 NOTE — Telephone Encounter (Signed)
Pt is aware and has no questions or concerns at this time  

## 2018-03-08 ENCOUNTER — Telehealth (INDEPENDENT_AMBULATORY_CARE_PROVIDER_SITE_OTHER): Payer: Self-pay | Admitting: Nurse Practitioner

## 2018-03-08 ENCOUNTER — Other Ambulatory Visit
Admission: RE | Admit: 2018-03-08 | Discharge: 2018-03-08 | Disposition: A | Discharge status: Home / Self Care | Payer: Managed Care, Other (non HMO) | Source: Ambulatory Visit | Attending: Nurse Practitioner | Admitting: Nurse Practitioner

## 2018-03-08 DIAGNOSIS — Z86718 Personal history of other venous thrombosis and embolism: Secondary | ICD-10-CM | POA: Diagnosis not present

## 2018-03-08 LAB — PROTIME-INR
INR: 2.66
Prothrombin Time: 28 seconds — ABNORMAL HIGH (ref 11.4–15.2)

## 2018-03-08 NOTE — Telephone Encounter (Signed)
Pt is aware and has no questions or concerns at this time  

## 2018-03-12 ENCOUNTER — Other Ambulatory Visit
Admission: RE | Admit: 2018-03-12 | Discharge: 2018-03-12 | Disposition: A | Discharge status: Home / Self Care | Payer: Managed Care, Other (non HMO) | Attending: Nurse Practitioner | Admitting: Nurse Practitioner

## 2018-03-12 ENCOUNTER — Telehealth (INDEPENDENT_AMBULATORY_CARE_PROVIDER_SITE_OTHER): Payer: Self-pay | Admitting: Nurse Practitioner

## 2018-03-12 DIAGNOSIS — Z86718 Personal history of other venous thrombosis and embolism: Secondary | ICD-10-CM | POA: Diagnosis present

## 2018-03-12 LAB — PROTIME-INR
INR: 2.41
PROTHROMBIN TIME: 25.9 s — AB (ref 11.4–15.2)

## 2018-03-12 NOTE — Telephone Encounter (Signed)
Pt is aware and has no questions or concerns at this time  

## 2018-03-19 ENCOUNTER — Other Ambulatory Visit
Admission: RE | Admit: 2018-03-19 | Discharge: 2018-03-19 | Disposition: A | Discharge status: Home / Self Care | Payer: Managed Care, Other (non HMO) | Source: Ambulatory Visit | Attending: Nurse Practitioner | Admitting: Nurse Practitioner

## 2018-03-19 DIAGNOSIS — Z86718 Personal history of other venous thrombosis and embolism: Secondary | ICD-10-CM | POA: Insufficient documentation

## 2018-03-19 LAB — PROTIME-INR
INR: 2.96
Prothrombin Time: 30.4 seconds — ABNORMAL HIGH (ref 11.4–15.2)

## 2018-03-20 ENCOUNTER — Telehealth (INDEPENDENT_AMBULATORY_CARE_PROVIDER_SITE_OTHER): Payer: Self-pay | Admitting: Vascular Surgery

## 2018-03-20 NOTE — Telephone Encounter (Signed)
Please let the patient know that the provider who ordered her INR would receive the results.  Colleen Campos has been out sick this week.  Her results only came in yesterday.  Her INR was:    Ref. Range 03/19/2018 07:46  INR Unknown 2.96   She should continue her current dose of Coumadin and have her INR drawn as per her normal routine.

## 2018-03-25 NOTE — Telephone Encounter (Signed)
I spoke with Vivia Birmingham NP and she advise for the patient to recheck inr in two weeks

## 2018-04-02 ENCOUNTER — Other Ambulatory Visit: Payer: Self-pay

## 2018-04-02 ENCOUNTER — Other Ambulatory Visit
Admission: RE | Admit: 2018-04-02 | Discharge: 2018-04-02 | Disposition: A | Discharge status: Home / Self Care | Payer: Managed Care, Other (non HMO) | Source: Ambulatory Visit | Attending: Nurse Practitioner | Admitting: Nurse Practitioner

## 2018-04-02 DIAGNOSIS — Z86718 Personal history of other venous thrombosis and embolism: Secondary | ICD-10-CM | POA: Diagnosis present

## 2018-04-02 LAB — PROTIME-INR
INR: 2 — AB (ref 0.8–1.2)
PROTHROMBIN TIME: 22.8 s — AB (ref 11.4–15.2)

## 2018-04-02 NOTE — Progress Notes (Signed)
INR is 2.0. Which is still in range, just on the low side. Continue to take 1 tab daily and we will recheck in two weeks.

## 2018-04-23 ENCOUNTER — Other Ambulatory Visit
Admission: RE | Admit: 2018-04-23 | Discharge: 2018-04-23 | Disposition: A | Discharge status: Home / Self Care | Payer: Managed Care, Other (non HMO) | Source: Ambulatory Visit | Attending: Nurse Practitioner | Admitting: Nurse Practitioner

## 2018-04-23 ENCOUNTER — Other Ambulatory Visit: Payer: Self-pay

## 2018-04-23 DIAGNOSIS — Z86718 Personal history of other venous thrombosis and embolism: Secondary | ICD-10-CM | POA: Diagnosis not present

## 2018-04-23 LAB — PROTIME-INR
INR: 1.9 — ABNORMAL HIGH (ref 0.8–1.2)
Prothrombin Time: 21.9 seconds — ABNORMAL HIGH (ref 11.4–15.2)

## 2018-04-23 NOTE — Progress Notes (Signed)
Her INR is a little low, let's make sure she is continuing to take her Coumadin as prescribed or if she has missed any doses.   If she has, she should take an additional tablet (2.5mg  ) today when she takes her medicine, and continue to take two pills on Tuesdays until her next check in one month.

## 2018-05-03 ENCOUNTER — Telehealth (INDEPENDENT_AMBULATORY_CARE_PROVIDER_SITE_OTHER): Payer: Self-pay | Admitting: Vascular Surgery

## 2018-05-03 NOTE — Telephone Encounter (Signed)
She should avoid leafy green veggies, because those are high in vitamin K...things like spinach, kale, collards

## 2018-05-03 NOTE — Telephone Encounter (Signed)
Left a message with medical advise on patient voicemail

## 2018-05-20 ENCOUNTER — Ambulatory Visit (INDEPENDENT_AMBULATORY_CARE_PROVIDER_SITE_OTHER): Payer: 59 | Admitting: Vascular Surgery

## 2018-05-21 ENCOUNTER — Other Ambulatory Visit
Admission: RE | Admit: 2018-05-21 | Discharge: 2018-05-21 | Disposition: A | Discharge status: Home / Self Care | Payer: Managed Care, Other (non HMO) | Attending: Nurse Practitioner | Admitting: Nurse Practitioner

## 2018-05-21 DIAGNOSIS — Z7901 Long term (current) use of anticoagulants: Secondary | ICD-10-CM | POA: Diagnosis present

## 2018-05-21 DIAGNOSIS — Z86718 Personal history of other venous thrombosis and embolism: Secondary | ICD-10-CM | POA: Diagnosis not present

## 2018-05-21 LAB — PROTIME-INR
INR: 1.9 — ABNORMAL HIGH (ref 0.8–1.2)
Prothrombin Time: 21.8 seconds — ABNORMAL HIGH (ref 11.4–15.2)

## 2018-05-21 NOTE — Progress Notes (Signed)
Her INR is a little low, let's make sure she is continuing to take her Coumadin as prescribed or if she has missed any doses.   If she has been taking her pills as requested,  let's change her dose to two tablets on Monday and Tuesday, and one tablet the rest of the days.   Also, we will recheck in two weeks vs. Four this time.

## 2018-06-04 ENCOUNTER — Other Ambulatory Visit: Payer: Self-pay

## 2018-06-04 ENCOUNTER — Other Ambulatory Visit
Admission: RE | Admit: 2018-06-04 | Discharge: 2018-06-04 | Disposition: A | Discharge status: Home / Self Care | Payer: Managed Care, Other (non HMO) | Attending: Nurse Practitioner | Admitting: Nurse Practitioner

## 2018-06-04 DIAGNOSIS — Z86718 Personal history of other venous thrombosis and embolism: Secondary | ICD-10-CM | POA: Diagnosis present

## 2018-06-04 LAB — PROTIME-INR
INR: 2.7 — ABNORMAL HIGH (ref 0.8–1.2)
Prothrombin Time: 28.4 seconds — ABNORMAL HIGH (ref 11.4–15.2)

## 2018-06-04 NOTE — Progress Notes (Signed)
INR is still a little low but coming up.  Lets do two tablets on M, T, W and one tablet on other days.  We will recheck in two weeks.

## 2018-06-25 ENCOUNTER — Other Ambulatory Visit
Admission: RE | Admit: 2018-06-25 | Discharge: 2018-06-25 | Disposition: A | Discharge status: Home / Self Care | Payer: Managed Care, Other (non HMO) | Source: Ambulatory Visit | Attending: Nurse Practitioner | Admitting: Nurse Practitioner

## 2018-06-25 ENCOUNTER — Other Ambulatory Visit: Payer: Self-pay

## 2018-06-25 DIAGNOSIS — Z86718 Personal history of other venous thrombosis and embolism: Secondary | ICD-10-CM | POA: Insufficient documentation

## 2018-06-25 LAB — PROTIME-INR
INR: 3.7 — ABNORMAL HIGH (ref 0.8–1.2)
Prothrombin Time: 36.2 seconds — ABNORMAL HIGH (ref 11.4–15.2)

## 2018-06-25 NOTE — Progress Notes (Signed)
Patient is taking 2 tablets Monday-Wednesday and 1 tablet Thursday-Sunday.Colleen Campos was concerned if there have been any medication changes or changes in diet .Patient stated that there is no medication or diet changes and that she is taking ibuprofen for her back.The patient was advise to hold medication for the next two days and start taking 1 tablet on Thursday-Sunday and take 2 tablets Monday and Tuesday and recheck INR in 2 weeks.

## 2018-06-25 NOTE — Progress Notes (Signed)
Please call and find out how the patient is taking her tablets.  She should hold her doses for today and tomorrow

## 2018-07-16 ENCOUNTER — Other Ambulatory Visit
Admission: RE | Admit: 2018-07-16 | Discharge: 2018-07-16 | Disposition: A | Discharge status: Home / Self Care | Payer: Managed Care, Other (non HMO) | Source: Ambulatory Visit | Attending: Nurse Practitioner | Admitting: Nurse Practitioner

## 2018-07-16 ENCOUNTER — Other Ambulatory Visit: Payer: Self-pay

## 2018-07-16 DIAGNOSIS — Z86718 Personal history of other venous thrombosis and embolism: Secondary | ICD-10-CM | POA: Diagnosis present

## 2018-07-16 LAB — PROTIME-INR
INR: 2.9 — ABNORMAL HIGH (ref 0.8–1.2)
Prothrombin Time: 29.9 seconds — ABNORMAL HIGH (ref 11.4–15.2)

## 2018-07-16 NOTE — Progress Notes (Signed)
Patient INR 2.9, she should continue to take same dosage

## 2018-07-16 NOTE — Progress Notes (Signed)
I left a message on patient voicemail with Eulogio Ditch NP medical instructions

## 2018-07-29 ENCOUNTER — Ambulatory Visit (INDEPENDENT_AMBULATORY_CARE_PROVIDER_SITE_OTHER): Payer: Managed Care, Other (non HMO) | Admitting: Vascular Surgery

## 2018-07-29 ENCOUNTER — Encounter (INDEPENDENT_AMBULATORY_CARE_PROVIDER_SITE_OTHER): Payer: Self-pay | Admitting: Vascular Surgery

## 2018-07-29 ENCOUNTER — Other Ambulatory Visit: Payer: Self-pay

## 2018-07-29 VITALS — BP 139/84 | HR 90 | Resp 16 | Wt 238.0 lb

## 2018-07-29 DIAGNOSIS — Z7901 Long term (current) use of anticoagulants: Secondary | ICD-10-CM

## 2018-07-29 DIAGNOSIS — I890 Lymphedema, not elsewhere classified: Secondary | ICD-10-CM

## 2018-07-29 DIAGNOSIS — I872 Venous insufficiency (chronic) (peripheral): Secondary | ICD-10-CM

## 2018-07-29 DIAGNOSIS — Z86718 Personal history of other venous thrombosis and embolism: Secondary | ICD-10-CM | POA: Diagnosis not present

## 2018-07-29 DIAGNOSIS — M79605 Pain in left leg: Secondary | ICD-10-CM | POA: Diagnosis not present

## 2018-07-29 NOTE — Progress Notes (Signed)
MRN : [id number]  Colleen Campos is a 48 y.o. (1970-09-25) female who presents with chief complaint of  Chief Complaint  Patient presents with  . Follow-up    72yr follow up  .  History of Present Illness:   The patient presents to the office for follow up of DVT.  DVT was identified at [facility] by Duplex ultrasound after she presented to the ER 12/22/2014.  The initial symptoms were pain and swelling in the lower extremity.  At tht point she also had a history of PE (but was only treated for 6 months)  The patient notes the leg continues to be very painful with dependency and swells quite a bite.    She feels that her left leg has actually worsened over the past 6 months.  She notes increasing size as well as increasing discoloration of her skin and a marked increase in the number of spider veins.  Her symptoms are much better with elevation.  The patient notes minimal edema in the morning which steadily worsens throughout the day.    The patient hasbeen using compression therapy at this point on a daily basis.  No SOB or pleuritic chest pains.  No cough or hemoptysis.  No blood per rectum or blood in any sputum.  No excessive   Current Meds  Medication Sig  . ALPRAZolam (XANAX) 1 MG tablet Take 1 mg by mouth 4 (four) times daily as needed for anxiety.   . divalproex (DEPAKOTE ER) 500 MG 24 hr tablet Take 1,000 mg by mouth daily.   . QUEtiapine (SEROQUEL) 300 MG tablet Take 300 mg by mouth at bedtime.  [name] warfarin (COUMADIN) 2.5 MG tablet Take 2 tablets (5 mg total) by mouth daily at 6 PM.    Past Medical History:  Diagnosis Date  . Anxiety   . Bipolar 1 disorder ([place])   . History of blood clots   . PTSD (post-traumatic stress disorder)     Past Surgical History:  Procedure Laterality Date  . CESAREAN SECTION    . DILATION AND CURETTAGE OF UTERUS      Social History Social History   Tobacco Use  . Smoking status: Current Every Day Smoker    Packs/day: 1.00   Types: Cigarettes  . Smokeless tobacco: Never Used  Substance Use Topics  . Alcohol use: Yes  . Drug use: Yes    Types: Marijuana    Comment: within the last month    Family History Family History  Problem Relation Age of Onset  . Diabetes Mother   . Cancer Mother   . Cancer Father   . Deep vein thrombosis Father   . Diabetes Father     Allergies  Allergen Reactions  . Tramadol     Other reaction(s): Headache  . Ativan [Lorazepam] Anxiety     REVIEW OF SYSTEMS (Negative unless checked)  Constitutional: [] Weight loss  [] Fever  [] Chills Cardiac: [] Chest pain   [] Chest pressure   [] Palpitations   [] Shortness of breath when laying flat   [] Shortness of breath with exertion. Vascular:  [] Pain in legs with walking   [x] Pain in legs at rest  [x] History of DVT   [] Phlebitis   [x] Swelling in legs   [x] Varicose veins   [] Non-healing ulcers Pulmonary:   [] Uses home oxygen   [] Productive cough   [] Hemoptysis   [] Wheeze  [] COPD   [] Asthma Neurologic:  [] Dizziness   [] Seizures   [] History of stroke   [] History of TIA  []   Aphasia   [] Vissual changes   [] Weakness or numbness in arm   [] Weakness or numbness in leg Musculoskeletal:   [] Joint swelling   [] Joint pain   [] Low back pain Hematologic:  [] Easy bruising  [] Easy bleeding   [] Hypercoagulable state   [] Anemic Gastrointestinal:  [] Diarrhea   [] Vomiting  [] Gastroesophageal reflux/heartburn   [] Difficulty swallowing. Genitourinary:  [] Chronic kidney disease   [] Difficult urination  [] Frequent urination   [] Blood in urine Skin:  [] Rashes   [] Ulcers  Psychological:  [] History of anxiety   []  History of major depression.  Physical Examination  Vitals:   07/29/18 0902  BP: 139/84  Pulse: 90  Resp: 16  Weight: 238 lb (108 kg)   Body mass index is 37.28 kg/m. Gen: WD/WN, NAD Head: [place]/AT, No temporalis wasting.  Ear/Nose/Throat: Hearing grossly intact, nares w/o erythema or drainage Eyes: PER, EOMI, sclera nonicteric.  Neck: Supple,  no large masses.   Pulmonary:  Good air movement, no audible wheezing bilaterally, no use of accessory muscles.  Cardiac: RRR, no JVD Vascular: scattered multiple varicosities present bilaterally.  Moderate venous stasis changes to the left leg.  3+ soft pitting edema Vessel Right Left  Radial Palpable Palpable  PT Palpable Palpable  DP Palpable Palpable  Gastrointestinal: Non-distended. No guarding/no peritoneal signs.  Musculoskeletal: M/S 5/5 throughout.  No deformity or atrophy.  Neurologic: CN 2-12 intact. Symmetrical.  Speech is fluent. Motor exam as listed above. Psychiatric: Judgment intact, Mood & affect appropriate for pt's clinical situation. Dermatologic: venous rashes no ulcers noted.  No changes consistent with cellulitis. Lymph : No lichenification or skin changes of chronic lymphedema.  CBC Lab Results  Component Value Date   WBC 5.3 07/29/2013   HGB 15.4 07/29/2013   HCT 45.8 07/29/2013   MCV 88 07/29/2013   PLT 150 07/29/2013    BMET    Component Value Date/Time   NA 136 01/22/2013 0636   K 3.9 01/22/2013 0636   CL 107 01/22/2013 0636   CO2 22 01/22/2013 0636   GLUCOSE 141 (H) 01/22/2013 0636   BUN 7 01/22/2013 0636   CREATININE 0.80 01/27/2013 1314   CALCIUM 8.2 (L) 01/22/2013 0636   GFRNONAA >60 01/27/2013 1314   GFRAA >60 01/27/2013 1314   CrCl cannot be calculated (Patient's most recent lab result is older than the maximum 21 days allowed.).  COAG Lab Results  Component Value Date   INR 2.9 (H) 07/16/2018   INR 3.7 (H) 06/25/2018   INR 2.7 (H) 06/04/2018    Radiology No results found.   Assessment/Plan 1. Chronic venous insufficiency Recommend:  No surgery or intervention at this point in time.    I have reviewed my previous discussion with the patient regarding swelling and why it causes symptoms.  Patient will continue wearing graduated compression stockings class 1 (20-30 mmHg) on a daily basis. The patient will  beginning wearing  the stockings first thing in the morning and removing them in the evening. The patient is instructed specifically not to sleep in the stockings.    In addition, behavioral modification including several periods of elevation of the lower extremities during the day will be continued.  This was reviewed with the patient during the initial visit.  The patient will also continue routine exercise, especially walking on a daily basis as was discussed during the initial visit.    Despite conservative treatments including graduated compression therapy class 1 and behavioral modification including exercise and elevation the patient  has not obtained adequate  control of the lymphedema.  The patient still has stage 3 lymphedema and therefore, I believe that a lymph pump should be added to improve the control of the patient's lymphedema.  Additionally, a lymph pump is warranted because it will reduce the risk of cellulitis and ulceration in the future.  Patient should follow-up in six months    2. Lymphedema Recommend:  No surgery or intervention at this point in time.    I have reviewed my previous discussion with the patient regarding swelling and why it causes symptoms.  Patient will continue wearing graduated compression stockings class 1 (20-30 mmHg) on a daily basis. The patient will  beginning wearing the stockings first thing in the morning and removing them in the evening. The patient is instructed specifically not to sleep in the stockings.    In addition, behavioral modification including several periods of elevation of the lower extremities during the day will be continued.  This was reviewed with the patient during the initial visit.  The patient will also continue routine exercise, especially walking on a daily basis as was discussed during the initial visit.    Despite conservative treatments including graduated compression therapy class 1 and behavioral modification including exercise and  elevation the patient  has not obtained adequate control of the lymphedema.  The patient still has stage 3 lymphedema and therefore, I believe that a lymph pump should be added to improve the control of the patient's lymphedema.  Additionally, a lymph pump is warranted because it will reduce the risk of cellulitis and ulceration in the future.  Patient should follow-up in six months    3. History of DVT (deep vein thrombosis) Continue anticoagulation   4. Pain of left lower extremity See #1    Levora DredgeGregory Dalia Jollie, MD  07/29/2018 9:07 AM

## 2018-08-13 ENCOUNTER — Other Ambulatory Visit: Payer: Self-pay

## 2018-08-13 ENCOUNTER — Other Ambulatory Visit
Admission: RE | Admit: 2018-08-13 | Discharge: 2018-08-13 | Disposition: A | Discharge status: Home / Self Care | Payer: Managed Care, Other (non HMO) | Source: Ambulatory Visit | Attending: Nurse Practitioner | Admitting: Nurse Practitioner

## 2018-08-13 ENCOUNTER — Telehealth (INDEPENDENT_AMBULATORY_CARE_PROVIDER_SITE_OTHER): Payer: Self-pay

## 2018-08-13 DIAGNOSIS — Z86718 Personal history of other venous thrombosis and embolism: Secondary | ICD-10-CM | POA: Insufficient documentation

## 2018-08-13 LAB — PROTIME-INR
INR: 1.9 — ABNORMAL HIGH (ref 0.8–1.2)
Prothrombin Time: 21.1 seconds — ABNORMAL HIGH (ref 11.4–15.2)

## 2018-08-13 NOTE — Progress Notes (Signed)
Let's find out how the patient has been taking her meds or if she has been on any new medications recently

## 2018-08-13 NOTE — Telephone Encounter (Signed)
The patient inform that she has been taking Magnesium citrate for leg cramps and she is taking 2 tablets Monday-Tuesday and 1 tablet the other days.I spoke with Eulogio Ditch NP and want the patient to take 2 tablet Monday-Wednesday and 1 the other days.Recheck in 2 weeks.Patient has been made aware with medical information

## 2018-08-13 NOTE — Telephone Encounter (Signed)
-----   Message from Kris Hartmann, NP sent at 08/13/2018 10:40 AM EDT ----- Let's find out how the patient has been taking her meds or if she has been on any new medications recently

## 2018-09-11 ENCOUNTER — Telehealth (INDEPENDENT_AMBULATORY_CARE_PROVIDER_SITE_OTHER): Payer: Self-pay | Admitting: Nurse Practitioner

## 2018-09-11 NOTE — Telephone Encounter (Signed)
This is from an e-mail from Kern [place] the rep for [employer].  she has never answered any of our calls or messages. Left a note on her door as well, but never heard from her.

## 2018-09-11 NOTE — Telephone Encounter (Signed)
Tell the patient to stop her Coumadin.  We have to recheck her INR to ensure that she is below 2, once she is we can send in a prescription for Eliquis.

## 2018-09-11 NOTE — Telephone Encounter (Signed)
Patient called requesting to d/c Coumadin and start taking Eliquis again. Please advise. AS< CMA

## 2018-09-11 NOTE — Telephone Encounter (Signed)
Patient has been advised of the below and stated that she will go tomorrow morning to have INR checked. AS, CMA

## 2018-09-12 ENCOUNTER — Other Ambulatory Visit
Admission: RE | Admit: 2018-09-12 | Discharge: 2018-09-12 | Disposition: A | Discharge status: Home / Self Care | Payer: Managed Care, Other (non HMO) | Source: Ambulatory Visit | Attending: Nurse Practitioner | Admitting: Nurse Practitioner

## 2018-09-12 DIAGNOSIS — Z86718 Personal history of other venous thrombosis and embolism: Secondary | ICD-10-CM | POA: Diagnosis not present

## 2018-09-12 LAB — PROTIME-INR
INR: 3.3 — ABNORMAL HIGH (ref 0.8–1.2)
Prothrombin Time: 33.2 seconds — ABNORMAL HIGH (ref 11.4–15.2)

## 2018-09-13 ENCOUNTER — Other Ambulatory Visit (INDEPENDENT_AMBULATORY_CARE_PROVIDER_SITE_OTHER): Payer: Self-pay | Admitting: Nurse Practitioner

## 2018-09-13 DIAGNOSIS — Z86718 Personal history of other venous thrombosis and embolism: Secondary | ICD-10-CM

## 2018-09-13 MED ORDER — APIXABAN 5 MG PO TABS: 5.0000 mg | ORAL_TABLET | Freq: Two times a day (BID) | ORAL | 6 refills | Status: DC

## 2018-09-13 NOTE — Progress Notes (Signed)
Patient INR is too high to start eliquis today.  I will send in Rx for eliquis but she should not start until Monday.  She needs to stop the coumadin today if she hasn't already.

## 2019-01-02 ENCOUNTER — Ambulatory Visit (INDEPENDENT_AMBULATORY_CARE_PROVIDER_SITE_OTHER): Payer: Self-pay | Admitting: Vascular Surgery

## 2019-02-24 ENCOUNTER — Ambulatory Visit (INDEPENDENT_AMBULATORY_CARE_PROVIDER_SITE_OTHER): Payer: Self-pay | Admitting: Vascular Surgery

## 2019-03-10 ENCOUNTER — Ambulatory Visit (INDEPENDENT_AMBULATORY_CARE_PROVIDER_SITE_OTHER): Payer: Commercial Managed Care - PPO | Admitting: Vascular Surgery

## 2019-03-10 ENCOUNTER — Encounter (INDEPENDENT_AMBULATORY_CARE_PROVIDER_SITE_OTHER): Payer: Self-pay

## 2019-03-10 ENCOUNTER — Other Ambulatory Visit: Payer: Self-pay

## 2019-03-10 ENCOUNTER — Encounter (INDEPENDENT_AMBULATORY_CARE_PROVIDER_SITE_OTHER): Payer: Self-pay | Admitting: Vascular Surgery

## 2019-03-10 VITALS — BP 120/81 | HR 84 | Resp 16 | Ht 67.0 in | Wt 236.0 lb

## 2019-03-10 DIAGNOSIS — M79605 Pain in left leg: Secondary | ICD-10-CM

## 2019-03-10 DIAGNOSIS — I872 Venous insufficiency (chronic) (peripheral): Secondary | ICD-10-CM

## 2019-03-10 DIAGNOSIS — I890 Lymphedema, not elsewhere classified: Secondary | ICD-10-CM | POA: Diagnosis not present

## 2019-03-10 NOTE — Progress Notes (Signed)
MRN : [id number]  Colleen Campos is a 49 y.o. (09-Apr-1970) female who presents with chief complaint of  Chief Complaint  Patient presents with  . Follow-up    6 mo lymph pump  .  History of Present Illness:   The patient returns to the office for followup evaluation regarding leg swelling.  She has been wearing graduated compression socks on a routine basis.  She works third shift and just got off at 7 AM this morning.  The swelling has improved quite a bit and the pain associated with swelling has decreased substantially. There have not been any interval development of a ulcerations or wounds.  Since the previous visit the patient has been wearing graduated compression stockings and has noted little significant improvement in the lymphedema. The patient has been using compression routinely morning until night.  The patient also states elevation during the day and exercise is being done too.  At this time the patient does not have a lymphedema pump.     Current Meds  Medication Sig  . ALPRAZolam (XANAX) 1 MG tablet Take 1 mg by mouth 4 (four) times daily as needed for anxiety.   [name] apixaban (ELIQUIS) 5 MG TABS tablet Take 1 tablet (5 mg total) by mouth 2 (two) times daily.  . divalproex (DEPAKOTE ER) 500 MG 24 hr tablet Take 1,000 mg by mouth daily.   [name] ibuprofen (ADVIL,MOTRIN) 800 MG tablet Take 1 tablet (800 mg total) by mouth every 8 (eight) hours as needed for moderate pain.  [name] QUEtiapine (SEROQUEL) 300 MG tablet Take 300 mg by mouth at bedtime.    Past Medical History:  Diagnosis Date  . Anxiety   . Bipolar 1 disorder (HCC)   . History of blood clots   . PTSD (post-traumatic stress disorder)     Past Surgical History:  Procedure Laterality Date  . CESAREAN SECTION    . DILATION AND CURETTAGE OF UTERUS      Social History Social History   Tobacco Use  . Smoking status: Current Every Day Smoker    Packs/day: 1.00    Types: Cigarettes  . Smokeless tobacco:  Never Used  Substance Use Topics  . Alcohol use: Yes  . Drug use: Yes    Types: Marijuana    Comment: within the last month    Family History Family History  Problem Relation Age of Onset  . Diabetes Mother   . Cancer Mother   . Cancer Father   . Deep vein thrombosis Father   . Diabetes Father     Allergies  Allergen Reactions  . Tramadol     Other reaction(s): Headache  . Ativan [Lorazepam] Anxiety     REVIEW OF SYSTEMS (Negative unless checked)  Constitutional: [] Weight loss  [] Fever  [] Chills Cardiac: [] Chest pain   [] Chest pressure   [] Palpitations   [] Shortness of breath when laying flat   [] Shortness of breath with exertion. Vascular:  [] Pain in legs with walking   [] Pain in legs at rest  [x] History of DVT   [] Phlebitis   [x] Swelling in legs   [] Varicose veins   [] Non-healing ulcers Pulmonary:   [] Uses home oxygen   [] Productive cough   [] Hemoptysis   [] Wheeze  [] COPD   [] Asthma Neurologic:  [] Dizziness   [] Seizures   [] History of stroke   [] History of TIA  [] Aphasia   [] Vissual changes   [] Weakness or numbness in arm   [] Weakness or numbness in leg Musculoskeletal:   [] Joint  swelling   [] Joint pain   [] Low back pain Hematologic:  [] Easy bruising  [] Easy bleeding   [] Hypercoagulable state   [] Anemic Gastrointestinal:  [] Diarrhea   [] Vomiting  [] Gastroesophageal reflux/heartburn   [] Difficulty swallowing. Genitourinary:  [] Chronic kidney disease   [] Difficult urination  [] Frequent urination   [] Blood in urine Skin:  [] Rashes   [] Ulcers  Psychological:  [] History of anxiety   []  History of major depression.  Physical Examination  Vitals:   03/10/19 0823  BP: 120/81  Pulse: 84  Resp: 16  Weight: 236 lb (107 kg)  Height: 5\' 7"  (1.702 m)   Body mass index is 36.96 kg/m. Gen: WD/WN, NAD Head: [place]/AT, No temporalis wasting.  Ear/Nose/Throat: Hearing grossly intact, nares w/o erythema or drainage Eyes: PER, EOMI, sclera nonicteric.  Neck: Supple, no large masses.    Pulmonary:  Good air movement, no audible wheezing bilaterally, no use of accessory muscles.  Cardiac: RRR, no JVD Vascular: Patient is wearing knee-high graduated compression.  The left leg is larger than the right consistent with her history of DVT on the left.  At this point the patient has trace to 1+ edema of the left.  There is excellent control. Gastrointestinal: Non-distended. No guarding/no peritoneal signs.  Musculoskeletal: M/S 5/5 throughout.  No deformity or atrophy.  Neurologic: CN 2-12 intact. Symmetrical.  Speech is fluent. Motor exam as listed above. Psychiatric: Judgment intact, Mood & affect appropriate for pt's clinical situation. Dermatologic: No rashes or ulcers noted.  No changes consistent with cellulitis.  CBC Lab Results  Component Value Date   WBC 5.3 07/29/2013   HGB 15.4 07/29/2013   HCT 45.8 07/29/2013   MCV 88 07/29/2013   PLT 150 07/29/2013    BMET    Component Value Date/Time   NA 136 01/22/2013 0636   K 3.9 01/22/2013 0636   CL 107 01/22/2013 0636   CO2 22 01/22/2013 0636   GLUCOSE 141 (H) 01/22/2013 0636   BUN 7 01/22/2013 0636   CREATININE 0.80 01/27/2013 1314   CALCIUM 8.2 (L) 01/22/2013 0636   GFRNONAA >60 01/27/2013 1314   GFRAA >60 01/27/2013 1314   CrCl cannot be calculated (Patient's most recent lab result is older than the maximum 21 days allowed.).  COAG Lab Results  Component Value Date   INR 3.3 (H) 09/12/2018   INR 1.9 (H) 08/13/2018   INR 2.9 (H) 07/16/2018    Radiology No results found.   Assessment/Plan 1. Lymphedema No surgery or intervention at this point in time.    I have reviewed my discussion with the patient regarding venous insufficiency and secondary lymph edema and why it  causes symptoms. I have discussed with the patient the chronic skin changes that accompany these problems and the long term sequela such as ulceration and infection.  Patient will continue wearing graduated compression stockings class 1  (20-30 mmHg) on a daily basis a prescription was given to the patient to keep this updated. The patient will  put the stockings on first thing in the morning and removing them in the evening. The patient is instructed specifically not to sleep in the stockings.  In addition, behavioral modification including elevation during the day will be continued.  Diet and salt restriction was also discussed.  Previous duplex ultrasound of the lower extremities shows normal deep venous system, superficial reflux was not present.   Following the review of the ultrasound the patient will follow up in 12 months to reassess the degree of swelling and the  control that graduated compression is offering.   The patient can be assessed for a Lymph Pump at that time.  However, at this time the patient states they are satisfied with the control compression and elevation is yielding.    2. Chronic venous insufficiency No surgery or intervention at this point in time.    I have reviewed my discussion with the patient regarding venous insufficiency and secondary lymph edema and why it  causes symptoms. I have discussed with the patient the chronic skin changes that accompany these problems and the long term sequela such as ulceration and infection.  Patient will continue wearing graduated compression stockings class 1 (20-30 mmHg) on a daily basis a prescription was given to the patient to keep this updated. The patient will  put the stockings on first thing in the morning and removing them in the evening. The patient is instructed specifically not to sleep in the stockings.  In addition, behavioral modification including elevation during the day will be continued.  Diet and salt restriction was also discussed.  Previous duplex ultrasound of the lower extremities shows normal deep venous system, superficial reflux was not present.   Following the review of the ultrasound the patient will follow up in 12 months to reassess the  degree of swelling and the control that graduated compression is offering.   The patient can be assessed for a Lymph Pump at that time.  However, at this time the patient states they are satisfied with the control compression and elevation is yielding.    3. Pain of left lower extremity See #1&2   Levora Dredge, MD  03/10/2019 8:35 AM

## 2019-04-30 ENCOUNTER — Other Ambulatory Visit (INDEPENDENT_AMBULATORY_CARE_PROVIDER_SITE_OTHER): Payer: Self-pay | Admitting: Nurse Practitioner

## 2019-04-30 DIAGNOSIS — Z86718 Personal history of other venous thrombosis and embolism: Secondary | ICD-10-CM

## 2019-04-30 NOTE — Telephone Encounter (Signed)
Please advise the pt was last seen in August last year is this ok to refill?

## 2019-11-11 ENCOUNTER — Other Ambulatory Visit: Payer: Self-pay | Admitting: Family Medicine

## 2019-11-11 DIAGNOSIS — Z1231 Encounter for screening mammogram for malignant neoplasm of breast: Secondary | ICD-10-CM

## 2019-12-06 ENCOUNTER — Other Ambulatory Visit (INDEPENDENT_AMBULATORY_CARE_PROVIDER_SITE_OTHER): Payer: Self-pay | Admitting: Nurse Practitioner

## 2019-12-06 DIAGNOSIS — Z86718 Personal history of other venous thrombosis and embolism: Secondary | ICD-10-CM

## 2020-02-04 ENCOUNTER — Ambulatory Visit
Admission: RE | Admit: 2020-02-04 | Discharge: 2020-02-04 | Disposition: A | Discharge status: Home / Self Care | Payer: Commercial Managed Care - PPO | Source: Ambulatory Visit | Attending: Family Medicine | Admitting: Family Medicine

## 2020-02-04 ENCOUNTER — Other Ambulatory Visit: Payer: Self-pay

## 2020-02-04 DIAGNOSIS — Z1231 Encounter for screening mammogram for malignant neoplasm of breast: Secondary | ICD-10-CM | POA: Diagnosis present

## 2020-03-06 NOTE — Progress Notes (Deleted)
MRN : [id number]  Colleen Campos is a 50 y.o. (February 28, 1970) female who presents with chief complaint of No chief complaint on file. [name]  History of Present Illness:   The patient returns to the office for followup evaluation regarding leg swelling.  The swelling has persisted but with the lymph pump is much, much better controlled. The pain associated with swelling is essentially eliminated. There have not been any interval development of a ulcerations or wounds.  The patient denies problems with the pump, noting it is working well and the leggings are in good condition.  Since the previous visit the patient has been wearing graduated compression stockings and using the lymph pump on a routine basis and  has noted significant improvement in the lymphedema.   Patient stated the lymph pump has been a very positive factor in her care.    No outpatient medications have been marked as taking for the 03/08/20 encounter (Appointment) with [name], [name], MD.    Past Medical History:  Diagnosis Date  . Anxiety   . Bipolar 1 disorder (HCC)   . History of blood clots   . PTSD (post-traumatic stress disorder)     Past Surgical History:  Procedure Laterality Date  . BREAST BIOPSY    . CESAREAN SECTION    . DILATION AND CURETTAGE OF UTERUS      Social History Social History   Tobacco Use  . Smoking status: Current Every Day Smoker    Packs/day: 1.00    Types: Cigarettes  . Smokeless tobacco: Never Used  Substance Use Topics  . Alcohol use: Yes  . Drug use: Yes    Types: Marijuana    Comment: within the last month    Family History Family History  Problem Relation Age of Onset  . Diabetes Mother   . Cancer Mother   . Cancer Father   . Deep vein thrombosis Father   . Diabetes Father   . Breast cancer Maternal Grandmother   . Breast cancer Paternal Grandmother     Allergies  Allergen Reactions  . Tramadol     Other reaction(s): Headache  . Ativan [Lorazepam]  Anxiety     REVIEW OF SYSTEMS (Negative unless checked)  Constitutional: [] Weight loss  [] Fever  [] Chills Cardiac: [] Chest pain   [] Chest pressure   [] Palpitations   [] Shortness of breath when laying flat   [] Shortness of breath with exertion. Vascular:  [] Pain in legs with walking   [] Pain in legs at rest  [] History of DVT   [] Phlebitis   [x] Swelling in legs   [] Varicose veins   [] Non-healing ulcers Pulmonary:   [] Uses home oxygen   [] Productive cough   [] Hemoptysis   [] Wheeze  [] COPD   [] Asthma Neurologic:  [] Dizziness   [] Seizures   [] History of stroke   [] History of TIA  [] Aphasia   [] Vissual changes   [] Weakness or numbness in arm   [] Weakness or numbness in leg Musculoskeletal:   [] Joint swelling   [] Joint pain   [] Low back pain Hematologic:  [] Easy bruising  [] Easy bleeding   [] Hypercoagulable state   [] Anemic Gastrointestinal:  [] Diarrhea   [] Vomiting  [] Gastroesophageal reflux/heartburn   [] Difficulty swallowing. Genitourinary:  [] Chronic kidney disease   [] Difficult urination  [] Frequent urination   [] Blood in urine Skin:  [] Rashes   [] Ulcers  Psychological:  [] History of anxiety   []  History of major depression.  Physical Examination  There were no vitals filed for this visit. There is no height  or weight on file to calculate BMI. Gen: WD/WN, NAD Head: [place]/AT, No temporalis wasting.  Ear/Nose/Throat: Hearing grossly intact, nares w/o erythema or drainage Eyes: PER, EOMI, sclera nonicteric.  Neck: Supple, no large masses.   Pulmonary:  Good air movement, no audible wheezing bilaterally, no use of accessory muscles.  Cardiac: RRR, no JVD Vascular: scattered varicosities present bilaterally.  Mild venous stasis changes to the legs bilaterally.  2-3+ soft pitting edema Vessel Right Left  Radial Palpable Palpable  Gastrointestinal: Non-distended. No guarding/no peritoneal signs.  Musculoskeletal: M/S 5/5 throughout.  No deformity or atrophy.  Neurologic: CN 2-12 intact.  Symmetrical.  Speech is fluent. Motor exam as listed above. Psychiatric: Judgment intact, Mood & affect appropriate for pt's clinical situation. Dermatologic: No rashes or ulcers noted.  No changes consistent with cellulitis. Lymph : + lichenification / skin changes of chronic lymphedema.  CBC Lab Results  Component Value Date   WBC 5.3 07/29/2013   HGB 15.4 07/29/2013   HCT 45.8 07/29/2013   MCV 88 07/29/2013   PLT 150 07/29/2013    BMET    Component Value Date/Time   NA 136 01/22/2013 0636   K 3.9 01/22/2013 0636   CL 107 01/22/2013 0636   CO2 22 01/22/2013 0636   GLUCOSE 141 (H) 01/22/2013 0636   BUN 7 01/22/2013 0636   CREATININE 0.80 01/27/2013 1314   CALCIUM 8.2 (L) 01/22/2013 0636   GFRNONAA >60 01/27/2013 1314   GFRAA >60 01/27/2013 1314   CrCl cannot be calculated (Patient's most recent lab result is older than the maximum 21 days allowed.).  COAG Lab Results  Component Value Date   INR 3.3 (H) 09/12/2018   INR 1.9 (H) 08/13/2018   INR 2.9 (H) 07/16/2018    Radiology No results found.   Assessment/Plan There are no diagnoses linked to this encounter.   Levora Dredge, MD  03/06/2020 1:58 PM

## 2020-03-08 ENCOUNTER — Ambulatory Visit (INDEPENDENT_AMBULATORY_CARE_PROVIDER_SITE_OTHER): Payer: Commercial Managed Care - PPO | Admitting: Vascular Surgery

## 2020-03-08 DIAGNOSIS — I890 Lymphedema, not elsewhere classified: Secondary | ICD-10-CM

## 2020-03-08 DIAGNOSIS — I872 Venous insufficiency (chronic) (peripheral): Secondary | ICD-10-CM

## 2020-03-22 ENCOUNTER — Ambulatory Visit (INDEPENDENT_AMBULATORY_CARE_PROVIDER_SITE_OTHER): Payer: Commercial Managed Care - PPO | Admitting: Vascular Surgery

## 2020-03-28 NOTE — Progress Notes (Signed)
MRN : [id number]  Colleen Campos is a 50 y.o. (1970/09/01) female who presents with chief complaint of No chief complaint on file. [name]  History of Present Illness:    The patient returns to the office for followup evaluation regarding leg swelling.  The swelling has persisted and the pain associated with swelling continues. There have not been any interval development of a ulcerations or wounds.  Since the previous visit the patient has been wearing graduated compression stockings and has noted little if any improvement in the lymphedema. The patient has been using compression routinely morning until night.   The patient also states elevation during the day and exercise is being done too.  With respect to her pain she notes it is bilateral and continuous.  Very severe to the point that she is calling off work.  The pain is in the entire leg and now goes all the way to the hip.  She has not had any evaluation of her back yet.   No outpatient medications have been marked as taking for the 03/29/20 encounter (Appointment) with [name], [name], MD.    Past Medical History:  Diagnosis Date  . Anxiety   . Bipolar 1 disorder (HCC)   . History of blood clots   . PTSD (post-traumatic stress disorder)     Past Surgical History:  Procedure Laterality Date  . BREAST BIOPSY    . CESAREAN SECTION    . DILATION AND CURETTAGE OF UTERUS      Social History Social History   Tobacco Use  . Smoking status: Current Every Day Smoker    Packs/day: 1.00    Types: Cigarettes  . Smokeless tobacco: Never Used  Substance Use Topics  . Alcohol use: Yes  . Drug use: Yes    Types: Marijuana    Comment: within the last month    Family History Family History  Problem Relation Age of Onset  . Diabetes Mother   . Cancer Mother   . Cancer Father   . Deep vein thrombosis Father   . Diabetes Father   . Breast cancer Maternal Grandmother   . Breast cancer Paternal Grandmother     Allergies   Allergen Reactions  . Tramadol     Other reaction(s): Headache  . Ativan [Lorazepam] Anxiety     REVIEW OF SYSTEMS (Negative unless checked)  Constitutional: [] Weight loss  [] Fever  [] Chills Cardiac: [] Chest pain   [] Chest pressure   [] Palpitations   [] Shortness of breath when laying flat   [] Shortness of breath with exertion. Vascular:  [x] Pain in legs with walking   [x] Pain in legs at rest  [] History of DVT   [] Phlebitis   [x] Swelling in legs   [x] Varicose veins   [] Non-healing ulcers Pulmonary:   [] Uses home oxygen   [] Productive cough   [] Hemoptysis   [] Wheeze  [] COPD   [] Asthma Neurologic:  [] Dizziness   [] Seizures   [] History of stroke   [] History of TIA  [] Aphasia   [] Vissual changes   [] Weakness or numbness in arm   [x] Weakness or numbness in leg Musculoskeletal:   [] Joint swelling   [] Joint pain   [] Low back pain Hematologic:  [] Easy bruising  [] Easy bleeding   [] Hypercoagulable state   [] Anemic Gastrointestinal:  [] Diarrhea   [] Vomiting  [] Gastroesophageal reflux/heartburn   [] Difficulty swallowing. Genitourinary:  [] Chronic kidney disease   [] Difficult urination  [] Frequent urination   [] Blood in urine Skin:  [] Rashes   [] Ulcers  Psychological:  [] History of  anxiety   []  History of major depression.  Physical Examination  There were no vitals filed for this visit. There is no height or weight on file to calculate BMI. Gen: WD/WN, NAD Head: [place]/AT, No temporalis wasting.  Ear/Nose/Throat: Hearing grossly intact, nares w/o erythema or drainage Eyes: PER, EOMI, sclera nonicteric.  Neck: Supple, no large masses.   Pulmonary:  Good air movement, no audible wheezing bilaterally, no use of accessory muscles.  Cardiac: RRR, no JVD Vascular: scattered varicosities present bilaterally.  Moderate venous stasis changes to the legs bilaterally.  3+ soft pitting edema. Vessel Right Left  Radial Palpable Palpable  PT Palpable Palpable  DP Palpable Palpable  Gastrointestinal:  Non-distended. No guarding/no peritoneal signs.  Musculoskeletal: M/S 5/5 throughout.  No deformity or atrophy.  Neurologic: CN 2-12 intact. Symmetrical.  Speech is fluent. Motor exam as listed above. Psychiatric: Judgment intact, Mood & affect appropriate for pt's clinical situation. Dermatologic: Moderate venous rashes no ulcers noted.  No changes consistent with cellulitis. Lymph : + lichenification or skin changes of chronic lymphedema.  CBC Lab Results  Component Value Date   WBC 5.3 07/29/2013   HGB 15.4 07/29/2013   HCT 45.8 07/29/2013   MCV 88 07/29/2013   PLT 150 07/29/2013    BMET    Component Value Date/Time   NA 136 01/22/2013 0636   K 3.9 01/22/2013 0636   CL 107 01/22/2013 0636   CO2 22 01/22/2013 0636   GLUCOSE 141 (H) 01/22/2013 0636   BUN 7 01/22/2013 0636   CREATININE 0.80 01/27/2013 1314   CALCIUM 8.2 (L) 01/22/2013 0636   GFRNONAA >60 01/27/2013 1314   GFRAA >60 01/27/2013 1314   CrCl cannot be calculated (Patient's most recent lab result is older than the maximum 21 days allowed.).  COAG Lab Results  Component Value Date   INR 3.3 (H) 09/12/2018   INR 1.9 (H) 08/13/2018   INR 2.9 (H) 07/16/2018    Radiology No results found.   Assessment/Plan 1. Lymphedema Recommend:  No surgery or intervention at this point in time.    I have reviewed my previous discussion with the patient regarding swelling and why it causes symptoms.  Patient will continue wearing graduated compression stockings class 1 (20-30 mmHg) on a daily basis. The patient will  beginning wearing the stockings first thing in the morning and removing them in the evening. The patient is instructed specifically not to sleep in the stockings.    In addition, behavioral modification including several periods of elevation of the lower extremities during the day will be continued.  This was reviewed with the patient during the initial visit.  The patient will also continue routine  exercise, especially walking on a daily basis as was discussed during the initial visit.    Despite conservative treatments including graduated compression therapy class 1 and behavioral modification including exercise and elevation the patient  has not obtained adequate control of the lymphedema.  The patient still has stage 3 lymphedema and therefore, I believe that a lymph pump should be added to improve the control of the patient's lymphedema.  Additionally, a lymph pump is warranted because it will reduce the risk of cellulitis and ulceration in the future.  Patient should follow-up in six months    2. History of DVT (deep vein thrombosis) Recommend:  No surgery or intervention at this point in time.    I have reviewed my previous discussion with the patient regarding swelling and why it causes symptoms.  Patient will continue wearing graduated compression stockings class 1 (20-30 mmHg) on a daily basis. The patient will  beginning wearing the stockings first thing in the morning and removing them in the evening. The patient is instructed specifically not to sleep in the stockings.    In addition, behavioral modification including several periods of elevation of the lower extremities during the day will be continued.  This was reviewed with the patient during the initial visit.  The patient will also continue routine exercise, especially walking on a daily basis as was discussed during the initial visit.    Despite conservative treatments including graduated compression therapy class 1 and behavioral modification including exercise and elevation the patient  has not obtained adequate control of the lymphedema.  The patient still has stage 3 lymphedema and therefore, I believe that a lymph pump should be added to improve the control of the patient's lymphedema.  Additionally, a lymph pump is warranted because it will reduce the risk of cellulitis and ulceration in the future.  Patient should  follow-up in six months    3. Chronic venous insufficiency Recommend:  No surgery or intervention at this point in time.    I have reviewed my previous discussion with the patient regarding swelling and why it causes symptoms.  Patient will continue wearing graduated compression stockings class 1 (20-30 mmHg) on a daily basis. The patient will  beginning wearing the stockings first thing in the morning and removing them in the evening. The patient is instructed specifically not to sleep in the stockings.    In addition, behavioral modification including several periods of elevation of the lower extremities during the day will be continued.  This was reviewed with the patient during the initial visit.  The patient will also continue routine exercise, especially walking on a daily basis as was discussed during the initial visit.    Despite conservative treatments including graduated compression therapy class 1 and behavioral modification including exercise and elevation the patient  has not obtained adequate control of the lymphedema.  The patient still has stage 3 lymphedema and therefore, I believe that a lymph pump should be added to improve the control of the patient's lymphedema.  Additionally, a lymph pump is warranted because it will reduce the risk of cellulitis and ulceration in the future.  Patient should follow-up in six months     Levora Dredge, MD  03/28/2020 4:41 PM

## 2020-03-29 ENCOUNTER — Encounter (INDEPENDENT_AMBULATORY_CARE_PROVIDER_SITE_OTHER): Payer: Self-pay | Admitting: Vascular Surgery

## 2020-03-29 ENCOUNTER — Other Ambulatory Visit: Payer: Self-pay

## 2020-03-29 ENCOUNTER — Ambulatory Visit (INDEPENDENT_AMBULATORY_CARE_PROVIDER_SITE_OTHER): Payer: Commercial Managed Care - PPO | Admitting: Vascular Surgery

## 2020-03-29 VITALS — BP 150/90 | HR 98 | Ht 67.0 in | Wt 232.0 lb

## 2020-03-29 DIAGNOSIS — I890 Lymphedema, not elsewhere classified: Secondary | ICD-10-CM | POA: Diagnosis not present

## 2020-03-29 DIAGNOSIS — Z86718 Personal history of other venous thrombosis and embolism: Secondary | ICD-10-CM | POA: Diagnosis not present

## 2020-03-29 DIAGNOSIS — I872 Venous insufficiency (chronic) (peripheral): Secondary | ICD-10-CM

## 2020-06-17 ENCOUNTER — Other Ambulatory Visit (INDEPENDENT_AMBULATORY_CARE_PROVIDER_SITE_OTHER): Payer: Self-pay | Admitting: Nurse Practitioner

## 2020-06-17 DIAGNOSIS — Z86718 Personal history of other venous thrombosis and embolism: Secondary | ICD-10-CM

## 2021-02-01 ENCOUNTER — Other Ambulatory Visit (INDEPENDENT_AMBULATORY_CARE_PROVIDER_SITE_OTHER): Payer: Self-pay | Admitting: Nurse Practitioner

## 2021-02-01 DIAGNOSIS — Z86718 Personal history of other venous thrombosis and embolism: Secondary | ICD-10-CM

## 2021-03-28 ENCOUNTER — Other Ambulatory Visit: Payer: Self-pay | Admitting: Family Medicine

## 2021-03-28 ENCOUNTER — Ambulatory Visit (INDEPENDENT_AMBULATORY_CARE_PROVIDER_SITE_OTHER): Payer: Commercial Managed Care - PPO | Admitting: Vascular Surgery

## 2021-03-28 DIAGNOSIS — Z1231 Encounter for screening mammogram for malignant neoplasm of breast: Secondary | ICD-10-CM

## 2021-04-11 ENCOUNTER — Ambulatory Visit (INDEPENDENT_AMBULATORY_CARE_PROVIDER_SITE_OTHER): Payer: Commercial Managed Care - PPO | Admitting: Vascular Surgery

## 2021-04-11 ENCOUNTER — Other Ambulatory Visit: Payer: Self-pay

## 2021-04-11 ENCOUNTER — Encounter (INDEPENDENT_AMBULATORY_CARE_PROVIDER_SITE_OTHER): Payer: Self-pay | Admitting: Vascular Surgery

## 2021-04-11 VITALS — BP 126/83 | HR 59 | Resp 16 | Ht 67.0 in | Wt 227.2 lb

## 2021-04-11 DIAGNOSIS — I872 Venous insufficiency (chronic) (peripheral): Secondary | ICD-10-CM

## 2021-04-11 DIAGNOSIS — I890 Lymphedema, not elsewhere classified: Secondary | ICD-10-CM | POA: Diagnosis not present

## 2021-04-11 NOTE — Progress Notes (Unsigned)
MRN : [id number]  Colleen Campos is a 51 y.o. (Feb 12, 1970) female who presents with chief complaint of check legs.  History of Present Illness:   The patient returns to the office for followup evaluation regarding leg swelling.  The swelling has persisted and the pain associated with swelling continues. There have not been any interval development of a ulcerations or wounds.   Since the previous visit the patient has been wearing graduated compression stockings and has noted little if any improvement in the lymphedema. The patient has been using compression routinely morning until night.    The patient also states elevation during the day and exercise is being done too.   With respect to her pain she notes it is bilateral and continuous.  Very severe to the point that she is calling off work.  The pain is in the entire leg and now goes all the way to the hip.  She has not had any evaluation of her back yet.    Current Meds  Medication Sig   albuterol (VENTOLIN HFA) 108 (90 Base) MCG/ACT inhaler SMARTSIG:2 Puff(s) By Mouth 4 Times Daily   ALPRAZolam (XANAX) 1 MG tablet Take 1 mg by mouth 4 (four) times daily as needed for anxiety.    citalopram (CELEXA) 20 MG tablet Take 20 mg by mouth daily.   divalproex (DEPAKOTE ER) 500 MG 24 hr tablet Take 1,000 mg by mouth daily.    ELIQUIS 5 MG TABS tablet TAKE 1 TABLET(5 MG) BY MOUTH TWICE DAILY   ibuprofen (ADVIL,MOTRIN) 800 MG tablet Take 1 tablet (800 mg total) by mouth every 8 (eight) hours as needed for moderate pain.   QUEtiapine (SEROQUEL) 300 MG tablet Take 300 mg by mouth at bedtime.    Past Medical History:  Diagnosis Date   Anxiety    Bipolar 1 disorder (HCC)    History of blood clots    PTSD (post-traumatic stress disorder)     Past Surgical History:  Procedure Laterality Date   BREAST BIOPSY     CESAREAN SECTION     DILATION AND CURETTAGE OF UTERUS      Social History Social History   Tobacco Use   Smoking status:  Every Day    Packs/day: 1.00    Types: Cigarettes   Smokeless tobacco: Never  Substance Use Topics   Alcohol use: Yes   Drug use: Yes    Types: Marijuana    Comment: within the last month    Family History Family History  Problem Relation Age of Onset   Diabetes Mother    Cancer Mother    Cancer Father    Deep vein thrombosis Father    Diabetes Father    Breast cancer Maternal Grandmother    Breast cancer Paternal Grandmother     Allergies  Allergen Reactions   Tramadol     Other reaction(s): Headache   Ativan [Lorazepam] Anxiety     REVIEW OF SYSTEMS (Negative unless checked)  Constitutional: [] Weight loss  [] Fever  [] Chills Cardiac: [] Chest pain   [] Chest pressure   [] Palpitations   [] Shortness of breath when laying flat   [] Shortness of breath with exertion. Vascular:  [] Pain in legs with walking   [] Pain in legs at rest  [] History of DVT   [] Phlebitis   [x] Swelling in legs   [] Varicose veins   [] Non-healing ulcers Pulmonary:   [] Uses home oxygen   [] Productive cough   [] Hemoptysis   [] Wheeze  [] COPD   [] Asthma Neurologic:  []   Dizziness   [] Seizures   [] History of stroke   [] History of TIA  [] Aphasia   [] Vissual changes   [] Weakness or numbness in arm   [] Weakness or numbness in leg Musculoskeletal:   [] Joint swelling   [] Joint pain   [] Low back pain Hematologic:  [] Easy bruising  [] Easy bleeding   [] Hypercoagulable state   [] Anemic Gastrointestinal:  [] Diarrhea   [] Vomiting  [] Gastroesophageal reflux/heartburn   [] Difficulty swallowing. Genitourinary:  [] Chronic kidney disease   [] Difficult urination  [] Frequent urination   [] Blood in urine Skin:  [] Rashes   [] Ulcers  Psychological:  [] History of anxiety   []  History of major depression.  Physical Examination  Vitals:   04/11/21 0855  BP: 126/83  Pulse: (!) 59  Resp: 16  Weight: 227 lb 3.2 oz (103.1 kg)  Height: 5\' 7"  (1.702 m)   Body mass index is 35.58 kg/m. Gen: WD/WN, NAD Head: [place]/AT, No temporalis  wasting.  Ear/Nose/Throat: Hearing grossly intact, nares w/o erythema or drainage, pinna without lesions Eyes: PER, EOMI, sclera nonicteric.  Neck: Supple, no gross masses.  No JVD.  Pulmonary:  Good air movement, no audible wheezing, no use of accessory muscles.  Cardiac: RRR, precordium not hyperdynamic. Vascular:  scattered varicosities present bilaterally.  Mild venous stasis changes to the legs bilaterally.  2+ soft pitting edema  Vessel Right Left  Radial Palpable Palpable  Gastrointestinal: soft, non-distended. No guarding/no peritoneal signs.  Musculoskeletal: M/S 5/5 throughout.  No deformity.  Neurologic: CN 2-12 intact. Pain and light touch intact in extremities.  Symmetrical.  Speech is fluent. Motor exam as listed above. Psychiatric: Judgment intact, Mood & affect appropriate for pt's clinical situation. Dermatologic: Venous rashes no ulcers noted.  No changes consistent with cellulitis. Lymph : No lichenification or skin changes of chronic lymphedema.  CBC Lab Results  Component Value Date   WBC 5.3 07/29/2013   HGB 15.4 07/29/2013   HCT 45.8 07/29/2013   MCV 88 07/29/2013   PLT 150 07/29/2013    BMET    Component Value Date/Time   NA 136 01/22/2013 0636   K 3.9 01/22/2013 0636   CL 107 01/22/2013 0636   CO2 22 01/22/2013 0636   GLUCOSE 141 (H) 01/22/2013 0636   BUN 7 01/22/2013 0636   CREATININE 0.80 01/27/2013 1314   CALCIUM 8.2 (L) 01/22/2013 0636   GFRNONAA >60 01/27/2013 1314   GFRAA >60 01/27/2013 1314   CrCl cannot be calculated (Patient's most recent lab result is older than the maximum 21 days allowed.).  COAG Lab Results  Component Value Date   INR 3.3 (H) 09/12/2018   INR 1.9 (H) 08/13/2018   INR 2.9 (H) 07/16/2018    Radiology No results found.   Assessment/Plan There are no diagnoses linked to this encounter.   , MD  04/11/2021 9:04 AM

## 2021-04-14 ENCOUNTER — Ambulatory Visit: Payer: Commercial Managed Care - PPO | Attending: Family Medicine

## 2021-04-17 ENCOUNTER — Encounter (INDEPENDENT_AMBULATORY_CARE_PROVIDER_SITE_OTHER): Payer: Self-pay | Admitting: Vascular Surgery

## 2021-05-18 ENCOUNTER — Encounter: Payer: Self-pay | Admitting: Emergency Medicine

## 2021-05-18 ENCOUNTER — Emergency Department: Payer: Self-pay

## 2021-05-18 ENCOUNTER — Other Ambulatory Visit: Payer: Self-pay

## 2021-05-18 ENCOUNTER — Emergency Department
Admission: EM | Admit: 2021-05-18 | Discharge: 2021-05-18 | Disposition: A | Discharge status: Home / Self Care | Payer: Self-pay | Attending: Emergency Medicine | Admitting: Emergency Medicine

## 2021-05-18 DIAGNOSIS — R519 Headache, unspecified: Secondary | ICD-10-CM | POA: Insufficient documentation

## 2021-05-18 DIAGNOSIS — Z7901 Long term (current) use of anticoagulants: Secondary | ICD-10-CM | POA: Insufficient documentation

## 2021-05-18 LAB — CBC WITH DIFFERENTIAL/PLATELET
Abs Immature Granulocytes: 0.02 10*3/uL (ref 0.00–0.07)
Basophils Absolute: 0.1 10*3/uL (ref 0.0–0.1)
Basophils Relative: 1 %
Eosinophils Absolute: 0.1 10*3/uL (ref 0.0–0.5)
Eosinophils Relative: 1 %
HCT: 46.9 % — ABNORMAL HIGH (ref 36.0–46.0)
Hemoglobin: 15.3 g/dL — ABNORMAL HIGH (ref 12.0–15.0)
Immature Granulocytes: 0 %
Lymphocytes Relative: 41 %
Lymphs Abs: 3.1 10*3/uL (ref 0.7–4.0)
MCH: 28.1 pg (ref 26.0–34.0)
MCHC: 32.6 g/dL (ref 30.0–36.0)
MCV: 86.2 fL (ref 80.0–100.0)
Monocytes Absolute: 0.4 10*3/uL (ref 0.1–1.0)
Monocytes Relative: 6 %
Neutro Abs: 3.9 10*3/uL (ref 1.7–7.7)
Neutrophils Relative %: 51 %
Platelets: 193 10*3/uL (ref 150–400)
RBC: 5.44 MIL/uL — ABNORMAL HIGH (ref 3.87–5.11)
RDW: 12.6 % (ref 11.5–15.5)
WBC: 7.7 10*3/uL (ref 4.0–10.5)
nRBC: 0 % (ref 0.0–0.2)

## 2021-05-18 LAB — BASIC METABOLIC PANEL
Anion gap: 9 (ref 5–15)
BUN: 10 mg/dL (ref 6–20)
CO2: 26 mmol/L (ref 22–32)
Calcium: 9 mg/dL (ref 8.9–10.3)
Chloride: 104 mmol/L (ref 98–111)
Creatinine, Ser: 0.8 mg/dL (ref 0.44–1.00)
GFR, Estimated: >60 mL/min (ref >60)
Glucose, Bld: 79 mg/dL (ref 70–99)
Potassium: 4 mmol/L (ref 3.5–5.1)
Sodium: 139 mmol/L (ref 135–145)

## 2021-05-18 MED ORDER — IOHEXOL 350 MG/ML SOLN
75.0000 mL | Freq: Once | INTRAVENOUS | Status: AC | PRN
  Administered 2021-05-18: 75 mL via INTRAVENOUS
  Filled 2021-05-18: qty 75

## 2021-05-18 MED ORDER — PROCHLORPERAZINE MALEATE 10 MG PO TABS: 10.0000 mg | ORAL_TABLET | Freq: Four times a day (QID) | ORAL | 0 refills | Status: DC | PRN

## 2021-05-18 MED ORDER — DIPHENHYDRAMINE HCL 50 MG/ML IJ SOLN
25.0000 mg | Freq: Once | INTRAMUSCULAR | Status: AC
  Administered 2021-05-18: 25 mg via INTRAVENOUS
  Filled 2021-05-18: qty 1

## 2021-05-18 MED ORDER — PROCHLORPERAZINE EDISYLATE 10 MG/2ML IJ SOLN
10.0000 mg | Freq: Once | INTRAMUSCULAR | Status: AC
  Administered 2021-05-18: 10 mg via INTRAVENOUS
  Filled 2021-05-18: qty 2

## 2021-05-18 MED ORDER — KETOROLAC TROMETHAMINE 30 MG/ML IJ SOLN
15.0000 mg | Freq: Once | INTRAMUSCULAR | Status: AC
  Administered 2021-05-18: 15 mg via INTRAVENOUS
  Filled 2021-05-18: qty 1

## 2021-05-18 MED ORDER — SODIUM CHLORIDE 0.9 % IV BOLUS
1000.0000 mL | Freq: Once | INTRAVENOUS | Status: AC
Start: 2021-05-18 — End: 2021-05-18
  Administered 2021-05-18: 1000 mL via INTRAVENOUS

## 2021-05-18 NOTE — ED Triage Notes (Signed)
Pt comes into the ED via POV c/o headache to the left side of the head.  Pt states it is sharp "lightening bolts of pain".  Pt states this has been ongoing since last Thursday.  Denies any h/o migraines.  Denies any photosensitivity or nausea.  Pt presents uncomfortable in triage at this time and admits this is the worst headache of her life.  Pt has significant h/o blood clots.  Denies any weakness or change in vision or speech.  ?

## 2021-05-18 NOTE — ED Provider Notes (Signed)
? ?[facility] ?Provider Note ? ? ? Event Date/Time  ? First MD Initiated Contact with Patient 05/18/21 1753   ?  (approximate) ? ? ?History  ? ?Chief Complaint ?Headache ? ? ?HPI ? ?Colleen Campos is a 51 y.o. female with past medical history of DVT/PE on Eliquis, lymphedema, and bipolar disorder who presents to the ED complaining of headache.  Patient reports that she has been dealing with waxing and waning headache for the past 5 days.  She describes it as a throbbing pain that primarily affects the left side of her head.  Pain is not exacerbated or alleviated by anything in particular, has seemed to ease up on occasion for about 15 minutes, but became severe earlier today.  She denies any history of similar headaches, specifically denies any history of migraines.  She has not had any photophobia, phonophobia, nausea, vomiting, vision changes, speech changes, numbness, or weakness.  She reports being compliant with her Eliquis and has not missed any doses. ?  ? ? ?Physical Exam  ? ?Triage Vital Signs: ?ED Triage Vitals  ?Enc Vitals Group  ?   BP 05/18/21 1706 (!) 152/86  ?   Pulse Rate 05/18/21 1706 86  ?   Resp 05/18/21 1706 17  ?   Temp 05/18/21 1706 98.9 ?F (37.2 ?C)  ?   Temp Source 05/18/21 1706 Oral  ?   SpO2 05/18/21 1706 94 %  ?   Weight 05/18/21 1707 227 lb 4.7 oz (103.1 kg)  ?   Height 05/18/21 1707 5\' 7"  (1.702 m)  ?   Head Circumference --   ?   Peak Flow --   ?   Pain Score 05/18/21 1707 10  ?   Pain Loc --   ?   Pain Edu? --   ?   Excl. in GC? --   ? ? ?Most recent vital signs: ?Vitals:  ? 05/18/21 1706  ?BP: (!) 152/86  ?Pulse: 86  ?Resp: 17  ?Temp: 98.9 ?F (37.2 ?C)  ?SpO2: 94%  ? ? ?Constitutional: Alert and oriented. ?Eyes: Conjunctivae are normal.  Pupils equal, round, and reactive to light bilaterally. ?Head: Atraumatic. ?Nose: No congestion/rhinnorhea. ?Mouth/Throat: Mucous membranes are moist.  ?Cardiovascular: Normal rate, regular rhythm. Grossly normal heart sounds.   2+ radial pulses bilaterally. ?Respiratory: Normal respiratory effort.  No retractions. Lungs CTAB. ?Gastrointestinal: Soft and nontender. No distention. ?Musculoskeletal: No lower extremity tenderness nor edema.  ?Neurologic:  Normal speech and language. No gross focal neurologic deficits are appreciated. ? ? ? ?ED Results / Procedures / Treatments  ? ?Labs ?(all labs ordered are listed, but only abnormal results are displayed) ?Labs Reviewed  ?CBC WITH DIFFERENTIAL/PLATELET - Abnormal; Notable for the following components:  ?    Result Value  ? RBC 5.44 (*)   ? Hemoglobin 15.3 (*)   ? HCT 46.9 (*)   ? All other components within normal limits  ?BASIC METABOLIC PANEL  ? ? ?RADIOLOGY ?CT head reviewed by me with no hemorrhage or midline shift ? ?PROCEDURES: ? ?Critical Care performed: No ? ?Procedures ? ? ?MEDICATIONS ORDERED IN ED: ?Medications  ?prochlorperazine (COMPAZINE) injection 10 mg (10 mg Intravenous Given 05/18/21 1846)  ?diphenhydrAMINE (BENADRYL) injection 25 mg (25 mg Intravenous Given 05/18/21 1847)  ?ketorolac (TORADOL) 30 MG/ML injection 15 mg (15 mg Intravenous Given 05/18/21 1847)  ?sodium chloride 0.9 % bolus 1,000 mL (1,000 mLs Intravenous New Bag/Given 05/18/21 1846)  ?iohexol (OMNIPAQUE) 350 MG/ML injection 75 mL (75  mLs Intravenous Contrast Given 05/18/21 1932)  ? ? ? ?IMPRESSION / MDM / ASSESSMENT AND PLAN / ED COURSE  ?I reviewed the triage vital signs and the nursing notes. ?             ?               ? ?51 y.o. female with past medical history of DVT/PE on Eliquis, lymphedema, and bipolar disorder who presents to the ED complaining of waxing and waning but now severe headache on the left side that has been present for the past 5 days. ? ?Differential diagnosis includes, but is not limited to, SAH, meningitis, venous sinus thrombosis, migraine, tension headache, cluster headache. ? ?Patient nontoxic-appearing and in no acute distress, vital signs are unremarkable and she has no focal  neurologic deficits on exam.  CT head reviewed by me and is negative for acute process, low suspicion for [facility] as headache have resolved but became more severe within the past 6 hours.  No fever or neck stiffness to suggest meningitis.  Patient does have long history of hypercoagulability and is at elevated risk for venous sinus thrombosis.  Imaging options discussed with radiology, who recommends proceeding with CT venogram.  We will treat with migraine cocktail, screen labs, and reassess. ? ?CT venogram is negative for acute process, labs are also reassuring with CBC showing no anemia or leukocytosis, BMP without electrolyte abnormality or AKI.  Patient reports improved headache on reassessment following IV Toradol, Compazine, and Benadryl.  Given reassuring work-up, she is appropriate for discharge home with PCP follow-up, will be prescribed small amount of Compazine for use as needed.  She was counseled to return to the ED for new or worsening symptoms, patient agrees with plan. ? ?  ? ? ?FINAL CLINICAL IMPRESSION(S) / ED DIAGNOSES  ? ?Final diagnoses:  ?Acute nonintractable headache, unspecified headache type  ? ? ? ?Rx / DC Orders  ? ?ED Discharge Orders   ? ?      Ordered  ?  prochlorperazine (COMPAZINE) 10 MG tablet  Every 6 hours PRN       ? 05/18/21 2055  ? ?  ?  ? ?  ? ? ? ?Note:  This document was prepared using Dragon voice recognition software and may include unintentional dictation errors. ?  ?Chesley Noon, MD ?05/18/21 2056 ? ?

## 2021-05-18 NOTE — ED Notes (Signed)
Iv started  meds given  siderails up x 2  family with pt.   ?

## 2021-07-27 ENCOUNTER — Other Ambulatory Visit (INDEPENDENT_AMBULATORY_CARE_PROVIDER_SITE_OTHER): Payer: Self-pay | Admitting: Nurse Practitioner

## 2021-07-27 ENCOUNTER — Telehealth (INDEPENDENT_AMBULATORY_CARE_PROVIDER_SITE_OTHER): Payer: Self-pay

## 2021-07-28 NOTE — Telephone Encounter (Signed)
Unfortunately we are only able to provide pain medication in the setting that it is related to a vascular cause.  The severity of the pain is concerning as typical leg swelling does not cause severe pain and the need for prescription pain medication.  This gives with concern that there is a larger underlying reason for the possible leg swelling.  I recommend that the patient be seen for a DVT study to ensure there is no DVT in the lower extremity.  If we are unable to see her shortly she may also be able to be seen by her primary care physician.

## 2021-07-28 NOTE — Telephone Encounter (Signed)
Patient was made aware with medical recommendations and verbalized understanding. Patient stated that she currently does not have any insurance and is not able to afford her Eliquis. Patient will like to move forward with being schedule but will need to know the cost for the ultrasound.

## 2021-07-29 ENCOUNTER — Other Ambulatory Visit (INDEPENDENT_AMBULATORY_CARE_PROVIDER_SITE_OTHER): Payer: Self-pay | Admitting: Nurse Practitioner

## 2021-07-29 DIAGNOSIS — M79605 Pain in left leg: Secondary | ICD-10-CM

## 2021-07-29 DIAGNOSIS — M7989 Other specified soft tissue disorders: Secondary | ICD-10-CM

## 2021-08-04 ENCOUNTER — Ambulatory Visit (INDEPENDENT_AMBULATORY_CARE_PROVIDER_SITE_OTHER): Payer: Commercial Managed Care - PPO

## 2021-08-04 DIAGNOSIS — M79605 Pain in left leg: Secondary | ICD-10-CM

## 2021-08-04 DIAGNOSIS — M7989 Other specified soft tissue disorders: Secondary | ICD-10-CM

## 2021-11-08 ENCOUNTER — Encounter: Payer: Self-pay | Admitting: Emergency Medicine

## 2021-11-08 ENCOUNTER — Other Ambulatory Visit: Payer: Self-pay

## 2021-11-08 ENCOUNTER — Emergency Department
Admission: EM | Admit: 2021-11-08 | Discharge: 2021-11-08 | Disposition: A | Discharge status: Home / Self Care | Payer: Self-pay | Attending: Emergency Medicine | Admitting: Emergency Medicine

## 2021-11-08 ENCOUNTER — Emergency Department: Payer: Self-pay

## 2021-11-08 DIAGNOSIS — I824Y2 Acute embolism and thrombosis of unspecified deep veins of left proximal lower extremity: Secondary | ICD-10-CM | POA: Insufficient documentation

## 2021-11-08 LAB — CBC WITH DIFFERENTIAL/PLATELET
Abs Immature Granulocytes: 0.03 10*3/uL (ref 0.00–0.07)
Basophils Absolute: 0.1 10*3/uL (ref 0.0–0.1)
Basophils Relative: 1 %
Eosinophils Absolute: 0.1 10*3/uL (ref 0.0–0.5)
Eosinophils Relative: 1 %
HCT: 48.2 % — ABNORMAL HIGH (ref 36.0–46.0)
Hemoglobin: 15.8 g/dL — ABNORMAL HIGH (ref 12.0–15.0)
Immature Granulocytes: 0 %
Lymphocytes Relative: 26 %
Lymphs Abs: 2.2 10*3/uL (ref 0.7–4.0)
MCH: 28.1 pg (ref 26.0–34.0)
MCHC: 32.8 g/dL (ref 30.0–36.0)
MCV: 85.6 fL (ref 80.0–100.0)
Monocytes Absolute: 0.5 10*3/uL (ref 0.1–1.0)
Monocytes Relative: 6 %
Neutro Abs: 5.6 10*3/uL (ref 1.7–7.7)
Neutrophils Relative %: 66 %
Platelets: 191 10*3/uL (ref 150–400)
RBC: 5.63 MIL/uL — ABNORMAL HIGH (ref 3.87–5.11)
RDW: 12.6 % (ref 11.5–15.5)
WBC: 8.3 10*3/uL (ref 4.0–10.5)
nRBC: 0 % (ref 0.0–0.2)

## 2021-11-08 LAB — COMPREHENSIVE METABOLIC PANEL
ALT: 15 U/L (ref 0–44)
AST: 14 U/L — ABNORMAL LOW (ref 15–41)
Albumin: 3.8 g/dL (ref 3.5–5.0)
Alkaline Phosphatase: 58 U/L (ref 38–126)
Anion gap: 6 (ref 5–15)
BUN: 10 mg/dL (ref 6–20)
CO2: 24 mmol/L (ref 22–32)
Calcium: 9.1 mg/dL (ref 8.9–10.3)
Chloride: 108 mmol/L (ref 98–111)
Creatinine, Ser: 0.87 mg/dL (ref 0.44–1.00)
GFR, Estimated: >60 mL/min (ref >60)
Glucose, Bld: 90 mg/dL (ref 70–99)
Potassium: 4.3 mmol/L (ref 3.5–5.1)
Sodium: 138 mmol/L (ref 135–145)
Total Bilirubin: 0.7 mg/dL (ref 0.3–1.2)
Total Protein: 7 g/dL (ref 6.5–8.1)

## 2021-11-08 LAB — APTT: aPTT: 28 seconds (ref 24–36)

## 2021-11-08 LAB — PROTIME-INR
INR: 1.1 (ref 0.8–1.2)
Prothrombin Time: 13.8 seconds (ref 11.4–15.2)

## 2021-11-08 MED ORDER — OXYCODONE HCL 5 MG PO TABS: 5.0000 mg | ORAL_TABLET | Freq: Three times a day (TID) | ORAL | 0 refills | Status: DC | PRN

## 2021-11-08 MED ORDER — ENOXAPARIN SODIUM 120 MG/0.8ML IJ SOSY
1.0000 mg/kg | PREFILLED_SYRINGE | Freq: Once | INTRAMUSCULAR | Status: AC
  Administered 2021-11-08: 102 mg via SUBCUTANEOUS
  Filled 2021-11-08: qty 0.68

## 2021-11-08 MED ORDER — ENOXAPARIN SODIUM 120 MG/0.8ML IJ SOSY: 1.0000 mg/kg | PREFILLED_SYRINGE | Freq: Two times a day (BID) | INTRAMUSCULAR | Status: DC

## 2021-11-08 MED ORDER — OXYCODONE HCL 5 MG PO TABS
5.0000 mg | ORAL_TABLET | Freq: Once | ORAL | Status: AC
  Administered 2021-11-08: 5 mg via ORAL
  Filled 2021-11-08: qty 1

## 2021-11-08 MED ORDER — APIXABAN (ELIQUIS) VTE STARTER PACK (10MG AND 5MG): ORAL_TABLET | ORAL | 0 refills | Status: DC

## 2021-11-08 MED ORDER — ENOXAPARIN SODIUM 100 MG/ML IJ SOSY: 1.0000 mg/kg | PREFILLED_SYRINGE | Freq: Two times a day (BID) | INTRAMUSCULAR | 0 refills | Status: DC

## 2021-11-08 NOTE — ED Triage Notes (Signed)
C/O burning and pain to posterior left leg. [name]  Has history of blood clots.  Symptoms have been ongoing x 2-3 weeks.

## 2021-11-08 NOTE — Consult Note (Signed)
ANTICOAGULATION CONSULT NOTE - Initial Consult  Pharmacy Consult for enoxaparin Indication: DVT  Allergies  Allergen Reactions   Tramadol     Other reaction(s): Headache   Ativan [Lorazepam] Anxiety    Patient Measurements: Height: 5\' 7"  (170.2 cm) Weight: 103.1 kg (227 lb 4.7 oz) IBW/kg (Calculated) : 61.6  Vital Signs: Temp: 98.7 F (37.1 C) (10/10 1118) Temp Source: Oral (10/10 1118) BP: 132/64 (10/10 1118) Pulse Rate: 66 (10/10 1118)  Labs: Recent Labs    11/08/21 1117  HGB 15.8*  HCT 48.2*  PLT 191  APTT 28  LABPROT 13.8  INR 1.1  CREATININE 0.87    Estimated Creatinine Clearance: 97.6 mL/min (by C-G formula based on SCr of 0.87 mg/dL).   Medical History: Past Medical History:  Diagnosis Date   Anxiety    Bipolar 1 disorder ([place])    History of blood clots    PTSD (post-traumatic stress disorder)     Medications:  Last PTA anticoagulant was in 05/2021  Assessment: 51 yo F with PMH chronic venous insufficiency, lymphedema, DVT presents with 2-3 week h/o leg swelling. Ultrasound found nonocclusive DVT of left femoral and left popliteal veins. From speaking with patient, has not taken any anticoagulants since at least May of this year. Pt has an extensive history of DVT, and has been on multiple anticoagulant medications, including warfarin, rivaroxaban, and apixaban. Unfortunately, due to insurance issues, pt is no longer able to afford DOACs but pt is wanting to try enoxaparin injections. Pt BMI is >30 so will use 1 mg/kg q12H dosing. Renal function currently stable. Pt is VSS.  Baseline Labs: aPTT - 28; INR - 1.1 Hgb - 15.8; Plts - 191  Goal of Therapy:  Monitor platelets by anticoagulation protocol: Yes   Plan:  Initiate enoxaparin 1 mg/kg subQ q12H Monitor CBC at least every 72 hours while on enoxaparin therapy  Dara Hoyer, PharmD PGY-1 Pharmacy Resident 11/08/2021 2:42 PM

## 2021-11-08 NOTE — ED Provider Notes (Signed)
[facility] Provider Note    Event Date/Time   First MD Initiated Contact with Patient 11/08/21 1202     (approximate)   History   Leg Pain   HPI  Colleen Campos is a 51 y.o. female with a past medical history of chronic venous insufficiency, lymphedema, DVT who presents today for evaluation of leg swelling for approximately 2 to 3 weeks.  She reports that she has pain to the posterior left leg.  She reports that she lost her job and therefore lost her health insurance and has been off of her anticoagulation for 6 months.  She reports that her pain began 2 to 3 weeks ago and she reports that it feels exactly the same as her previous blood clots.  She denies chest pain or shortness of breath.  She has not noticed any skin color changes.  She denies paresthesias.  Patient Active Problem List   Diagnosis Date Noted   Chronic venous insufficiency 07/29/2018   Lymphedema 07/29/2018   History of DVT (deep vein thrombosis) 05/17/2017   Leg pain 05/17/2017   Leg swelling 05/17/2017          Physical Exam   Triage Vital Signs: ED Triage Vitals  Enc Vitals Group     BP 11/08/21 1118 132/64     Pulse Rate 11/08/21 1118 66     Resp 11/08/21 1118 16     Temp 11/08/21 1118 98.7 F (37.1 C)     Temp Source 11/08/21 1118 Oral     SpO2 11/08/21 1118 97 %     Weight 11/08/21 1114 227 lb 4.7 oz (103.1 kg)     Height 11/08/21 1114 5\' 7"  (1.702 m)     Head Circumference --      Peak Flow --      Pain Score 11/08/21 1114 8     Pain Loc --      Pain Edu? --      Excl. in [place]? --     Most recent vital signs: Vitals:   11/08/21 1118 11/08/21 1509  BP: 132/64 130/60  Pulse: 66 78  Resp: 16 16  Temp: 98.7 F (37.1 C) 98 F (36.7 C)  SpO2: 97% 98%    Physical Exam Vitals and nursing note reviewed.  Constitutional:      General: Awake and alert. No acute distress.    Appearance: Normal appearance. The patient is normal weight.  HENT:     Head:  Normocephalic and atraumatic.     Mouth: Mucous membranes are moist.  Eyes:     General: PERRL. Normal EOMs        Right eye: No discharge.        Left eye: No discharge.     Conjunctiva/sclera: Conjunctivae normal.  Cardiovascular:     Rate and Rhythm: Normal rate and regular rhythm.     Pulses: Normal pulses.  Pulmonary:     Effort: Pulmonary effort is normal. No respiratory distress.     Breath sounds: Normal breath sounds.  Abdominal:     Abdomen is soft. There is no abdominal tenderness. No rebound or guarding. No distention. Musculoskeletal:        General: No swelling. Normal range of motion.     Cervical back: Normal range of motion and neck supple.  Pitting edema noted to left lower extremity without skin changes.  Sensation intact light touch throughout.  Normal distal pulses.  Normal strength and sensation throughout leg.  Normal plantarflexion and dorsiflexion against resistance.  Normal flexion and extension at the knee and hip. Skin:    General: Skin is warm and dry.     Capillary Refill: Capillary refill takes less than 2 seconds.     Findings: No rash.  Neurological:     Mental Status: The patient is awake and alert.      ED Results / Procedures / Treatments   Labs (all labs ordered are listed, but only abnormal results are displayed) Labs Reviewed  CBC WITH DIFFERENTIAL/PLATELET - Abnormal; Notable for the following components:      Result Value   RBC 5.63 (*)    Hemoglobin 15.8 (*)    HCT 48.2 (*)    All other components within normal limits  COMPREHENSIVE METABOLIC PANEL - Abnormal; Notable for the following components:   AST 14 (*)    All other components within normal limits  PROTIME-INR  APTT     EKG     RADIOLOGY I independently reviewed and interpreted imaging and agree with radiologists findings.     PROCEDURES:  Critical Care performed:   Procedures   MEDICATIONS ORDERED IN ED: Medications  enoxaparin (LOVENOX) injection 102  mg (has no administration in time range)  oxyCODONE (Oxy IR/ROXICODONE) immediate release tablet 5 mg (5 mg Oral Given 11/08/21 1310)  enoxaparin (LOVENOX) injection 102 mg (102 mg Subcutaneous Given 11/08/21 1509)     IMPRESSION / MDM / ASSESSMENT AND PLAN / ED COURSE  I reviewed the triage vital signs and the nursing notes.   Differential diagnosis includes, but is not limited to, DVT, Baker's cyst, dependent edema.  Patient is awake and alert, hemodynamically stable and afebrile.  She does not appear to be volume overloaded.  Labs are obtained and are overall reassuring.  Duplex ultrasound is positive for nonocclusive femoral and popliteal DVT.  There is no evidence of phlegmasia or cerulea dolens.  Initial plan was to start her on Eliquis and patient was initially agreeable to this, however she changed her mind and wish to be started on Lovenox instead.  Pharmacy was consulted for the appropriate dosing, and patient was able to demonstrate proper use of the Lovenox.  We discussed the importance of taking this as prescribed without missing any doses.  She was instructed to follow-up with vascular surgery and the appropriate information was provided.  She does not have any chest pain or shortness of breath or pleurisy.  There is no tachycardia or hypoxia to suggest pulmonary embolism.  She requested a prescription for pain medication and reports that Tylenol does not work.  She cannot have nonsteroidal anti-inflammatories given that she will be anticoagulated.  She was given a small amount of oxycodone, though advised that this is highly addictive and should only be used for breakthrough pain when Tylenol does not work first.  She was also advised that she cannot drive, operate heavy machinery, perform any test or require concentration while taking this medication.  PDMP was reviewed, she has received benzodiazepines, though no oxycodone.  She was advised that she cannot take this medicine in conjunction  with her benzodiazepine and she understands and agrees.  We discussed return precautions and the importance of close outpatient follow-up.  She understands and agrees with plan.  She was discharged in stable condition.   Patient's presentation is most consistent with acute complicated illness / injury requiring diagnostic workup.   Clinical Course as of 11/08/21 [place] Nov 08, 2021  1239 Pharmacy is  currently figuring out Lovenox dosing as patient does not want Eliquis [JP]    Clinical Course User Index [JP] Yaneth Fairbairn, Clarnce Flock, PA-C     FINAL CLINICAL IMPRESSION(S) / ED DIAGNOSES   Final diagnoses:  Acute deep vein thrombosis (DVT) of proximal vein of left lower extremity ([place])     Rx / DC Orders   ED Discharge Orders          Ordered    APIXABAN (ELIQUIS) VTE STARTER PACK (10MG  AND 5MG )  Status:  Discontinued        11/08/21 1232    enoxaparin (LOVENOX) per pharmacy consult       Provider:  (Not yet assigned)   11/08/21 1239    enoxaparin (LOVENOX) 100 MG/ML injection  2 times daily        11/08/21 1451    oxyCODONE (ROXICODONE) 5 MG immediate release tablet  Every 8 hours PRN        11/08/21 1509             Note:  This document was prepared using Dragon voice recognition software and may include unintentional dictation errors.   Marquette Old, PA-C 11/08/21 1544    Lucillie Garfinkel, MD 11/08/21 2047

## 2021-11-08 NOTE — Discharge Instructions (Addendum)
It is very important that you take the Lovenox as prescribed given that you have a blood clot.  You were given a prescription for 1 months worth in the emergency department, however you need to have this prescription refilled by your primary care provider for a total of 3 months.  This needs to be taken twice a day, your first dose was given in the ER.  Please return the emergency department immediately if you develop pain in your chest, pain with taking deep breaths, or shortness of breath.  Please also return if you notice skin changes to your leg.  Please follow-up with vascular surgery.  It was a pleasure caring for you today.

## 2021-11-10 ENCOUNTER — Telehealth (INDEPENDENT_AMBULATORY_CARE_PROVIDER_SITE_OTHER): Payer: Self-pay | Admitting: Vascular Surgery

## 2021-11-10 ENCOUNTER — Other Ambulatory Visit (INDEPENDENT_AMBULATORY_CARE_PROVIDER_SITE_OTHER): Payer: Self-pay | Admitting: Nurse Practitioner

## 2021-11-10 MED ORDER — OXYCODONE HCL 5 MG PO TABS
5.0000 mg | ORAL_TABLET | Freq: Three times a day (TID) | ORAL | 0 refills | Status: DC | PRN
Start: 2021-11-10 — End: 2023-12-03

## 2021-11-10 NOTE — Telephone Encounter (Signed)
Send medication to [place], [place]

## 2021-11-10 NOTE — Telephone Encounter (Signed)
sent 

## 2021-11-10 NOTE — Telephone Encounter (Signed)
Patient was in the hospital on 11/08/21 for blood clots in her leg.  She states she is not on Eliquis, she is taking Enoxaparin. She has already got her first supply.  She wants to know if she still need to see Dr. Delana Meyer but  she needs a pain medication because the hospital on gave her 4 pills of Oxychodone 5mg .

## 2021-12-16 ENCOUNTER — Telehealth (INDEPENDENT_AMBULATORY_CARE_PROVIDER_SITE_OTHER): Payer: Self-pay | Admitting: Nurse Practitioner

## 2021-12-16 ENCOUNTER — Other Ambulatory Visit (INDEPENDENT_AMBULATORY_CARE_PROVIDER_SITE_OTHER): Payer: Self-pay | Admitting: Nurse Practitioner

## 2021-12-16 MED ORDER — ENOXAPARIN SODIUM 100 MG/ML IJ SOSY: 1.0000 mg/kg | PREFILLED_SYRINGE | Freq: Two times a day (BID) | INTRAMUSCULAR | 0 refills | Status: DC

## 2021-12-16 NOTE — Telephone Encounter (Signed)
Patient called in wanting to get a refill on medication    [facility] [address] [facility], [place] - [address]     enoxaparin (LOVENOX) 100 MG/ML injection (Expired)

## 2021-12-16 NOTE — Telephone Encounter (Signed)
Patient made aware.

## 2021-12-16 NOTE — Telephone Encounter (Signed)
sent 

## 2021-12-20 ENCOUNTER — Other Ambulatory Visit (INDEPENDENT_AMBULATORY_CARE_PROVIDER_SITE_OTHER): Payer: Self-pay | Admitting: Nurse Practitioner

## 2021-12-20 MED ORDER — ENOXAPARIN SODIUM 100 MG/ML IJ SOSY: 100.0000 mg | PREFILLED_SYRINGE | Freq: Two times a day (BID) | INTRAMUSCULAR | 0 refills | Status: DC

## 2021-12-20 NOTE — Telephone Encounter (Signed)
Dr. [name] would like her to come in to discuss her recent DVT

## 2021-12-20 NOTE — Telephone Encounter (Signed)
Patient called in because medication was sent to wrong pharmacy. Patient is asking for it to be called into correct pharmacy where she has a coupon and can afford medication    [facility] [address] - [place], [place] - [address] AT [address] [address]   enoxaparin (LOVENOX) 100 MG/ML injection

## 2021-12-25 NOTE — Progress Notes (Deleted)
MRN : [id number]  Colleen Campos is a 51 y.o. (1970/12/13) female who presents with chief complaint of legs hurt and swell.  History of Present Illness:   The patient is a 51 yo F with PMH chronic venous insufficiency, lymphedema, DVT who was recently seen in the [facility] ER (11/08/2021) with 2-3 week h/o leg swelling. Ultrasound found nonocclusive DVT of left femoral and left popliteal veins. From speaking with patient, has not taken any anticoagulants since at least May of this year. Pt has an extensive history of DVT, and has been on multiple anticoagulant medications, including warfarin, rivaroxaban, and apixaban. Unfortunately, due to insurance issues, pt is no longer able to afford DOACs but pt is wanting to try enoxaparin injections. Pt BMI is >30 so will use 1 mg/kg q12H dosing. Renal function currently stable.    The patient returns to the office for followup evaluation regarding leg pain and swelling as well as issue regarding anticoagulation.  The swelling has persisted and the pain associated with swelling continues. There have not been any interval development of a ulcerations or wounds.   Since the previous visit the patient has been wearing graduated compression stockings and has some improvement or stability in the lymphedema. The patient has been using compression routinely morning until night. Not using her lymph pump much.  No outpatient medications have been marked as taking for the 12/26/21 encounter (Appointment) with [name], [name], MD.    Past Medical History:  Diagnosis Date   Anxiety    Bipolar 1 disorder (HCC)    History of blood clots    PTSD (post-traumatic stress disorder)     Past Surgical History:  Procedure Laterality Date   BREAST BIOPSY     CESAREAN SECTION     DILATION AND CURETTAGE OF UTERUS      Social History Social History   Tobacco Use   Smoking status: Every Day    Packs/day: 1.00    Types: Cigarettes   Smokeless tobacco:  Never  Substance Use Topics   Alcohol use: Yes   Drug use: Yes    Types: Marijuana    Comment: within the last month    Family History Family History  Problem Relation Age of Onset   Diabetes Mother    Cancer Mother    Cancer Father    Deep vein thrombosis Father    Diabetes Father    Breast cancer Maternal Grandmother    Breast cancer Paternal Grandmother     Allergies  Allergen Reactions   Tramadol     Other reaction(s): Headache   Ativan [Lorazepam] Anxiety     REVIEW OF SYSTEMS (Negative unless checked)  Constitutional: [] Weight loss  [] Fever  [] Chills Cardiac: [] Chest pain   [] Chest pressure   [] Palpitations   [] Shortness of breath when laying flat   [] Shortness of breath with exertion. Vascular:  [] Pain in legs with walking   [x] Pain in legs at rest  [] History of DVT   [] Phlebitis   [x] Swelling in legs   [] Varicose veins   [] Non-healing ulcers Pulmonary:   [] Uses home oxygen   [] Productive cough   [] Hemoptysis   [] Wheeze  [] COPD   [] Asthma Neurologic:  [] Dizziness   [] Seizures   [] History of stroke   [] History of TIA  [] Aphasia   [] Vissual changes   [] Weakness or numbness in arm   [] Weakness or numbness in leg Musculoskeletal:   [] Joint swelling   [] Joint pain   []   Low back pain Hematologic:  [] Easy bruising  [] Easy bleeding   [] Hypercoagulable state   [] Anemic Gastrointestinal:  [] Diarrhea   [] Vomiting  [] Gastroesophageal reflux/heartburn   [] Difficulty swallowing. Genitourinary:  [] Chronic kidney disease   [] Difficult urination  [] Frequent urination   [] Blood in urine Skin:  [] Rashes   [] Ulcers  Psychological:  [] History of anxiety   []  History of major depression.  Physical Examination  There were no vitals filed for this visit. There is no height or weight on file to calculate BMI. Gen: WD/WN, NAD Head: [place]/AT, No temporalis wasting.  Ear/Nose/Throat: Hearing grossly intact, nares w/o erythema or drainage, pinna without lesions Eyes: PER, EOMI, sclera  nonicteric.  Neck: Supple, no gross masses.  No JVD.  Pulmonary:  Good air movement, no audible wheezing, no use of accessory muscles.  Cardiac: RRR, precordium not hyperdynamic. Vascular:  scattered varicosities present bilaterally.  Moderate venous stasis changes to the legs bilaterally.  2+ soft pitting edema  Vessel Right Left  Radial Palpable Palpable  Gastrointestinal: soft, non-distended. No guarding/no peritoneal signs.  Musculoskeletal: M/S 5/5 throughout.  No deformity.  Neurologic: CN 2-12 intact. Pain and light touch intact in extremities.  Symmetrical.  Speech is fluent. Motor exam as listed above. Psychiatric: Judgment intact, Mood & affect appropriate for pt's clinical situation. Dermatologic: Venous rashes no ulcers noted.  No changes consistent with cellulitis. Lymph : No lichenification or skin changes of chronic lymphedema.  CBC Lab Results  Component Value Date   WBC 8.3 11/08/2021   HGB 15.8 (H) 11/08/2021   HCT 48.2 (H) 11/08/2021   MCV 85.6 11/08/2021   PLT 191 11/08/2021    BMET    Component Value Date/Time   NA 138 11/08/2021 1117   NA 136 01/22/2013 0636   K 4.3 11/08/2021 1117   K 3.9 01/22/2013 0636   CL 108 11/08/2021 1117   CL 107 01/22/2013 0636   CO2 24 11/08/2021 1117   CO2 22 01/22/2013 0636   GLUCOSE 90 11/08/2021 1117   GLUCOSE 141 (H) 01/22/2013 0636   BUN 10 11/08/2021 1117   BUN 7 01/22/2013 0636   CREATININE 0.87 11/08/2021 1117   CREATININE 0.80 01/27/2013 1314   CALCIUM 9.1 11/08/2021 1117   CALCIUM 8.2 (L) 01/22/2013 0636   GFRNONAA >60 11/08/2021 1117   GFRNONAA >60 01/27/2013 1314   GFRAA >60 01/27/2013 1314   CrCl cannot be calculated (Patient's most recent lab result is older than the maximum 21 days allowed.).  COAG Lab Results  Component Value Date   INR 1.1 11/08/2021   INR 3.3 (H) 09/12/2018   INR 1.9 (H) 08/13/2018    Radiology No results found.   Assessment/Plan There are no diagnoses linked to this  encounter.   01/08/2022, MD  12/25/2021 3:29 PM

## 2021-12-26 ENCOUNTER — Ambulatory Visit (INDEPENDENT_AMBULATORY_CARE_PROVIDER_SITE_OTHER): Payer: Self-pay | Admitting: Vascular Surgery

## 2021-12-26 DIAGNOSIS — I890 Lymphedema, not elsewhere classified: Secondary | ICD-10-CM

## 2021-12-26 DIAGNOSIS — M79605 Pain in left leg: Secondary | ICD-10-CM

## 2021-12-26 DIAGNOSIS — Z86718 Personal history of other venous thrombosis and embolism: Secondary | ICD-10-CM

## 2021-12-26 DIAGNOSIS — I872 Venous insufficiency (chronic) (peripheral): Secondary | ICD-10-CM

## 2022-01-08 NOTE — Progress Notes (Signed)
MRN : [id number]  Colleen Campos is a 51 y.o. ([birth year]) female who presents with chief complaint of legs hurt and swell.  History of Present Illness:  The patient returns to the office for followup evaluation regarding leg swelling.  The swelling has persisted and the pain associated with swelling continues and she is requesting pain medication. There have not been any interval development of a ulcerations or wounds.  She was seen in the ER 11/08/2021 for the acute onset of pain and swelling in the left leg, on going at that time for 2-3 weeks.   She had stopped her anticoagulation several months ago because of cost.  Duplex ultrasound was done (see below) and she was started on Lovenox at her request again secondary to cost and the fact that "Coumadin doesn't work for me".   The patient also states elevation during the day is being done too.   Her life has been filled with multiple stressors and so she has not been using her pump much.   With respect to her pain she notes it is bilateral and continuous.  The pain is in the entire leg and now goes all the way to the hip.  She still has not had any evaluation of her back yet, she states she can't afford it.  Venous duplex dated 11/08/2021 left lower extremity No filling defects to suggest DVT on grayscale or color Doppler imaging. Doppler waveforms show normal direction of venous flow, normal respiratory plasticity and response to augmentation.  I have personally reviewed the images and do not see the area of concern that is being described.  In fact I concur with the majority of the report that states there is normal compressibility and normal phasic venous flow.    No outpatient medications have been marked as taking for the 01/09/22 encounter (Appointment) with Delana Meyer, Dolores Lory, MD.    Past Medical History:  Diagnosis Date   Anxiety    Bipolar 1 disorder ([place])    History of blood clots    PTSD (post-traumatic stress  disorder)     Past Surgical History:  Procedure Laterality Date   BREAST BIOPSY     CESAREAN SECTION     DILATION AND CURETTAGE OF UTERUS      Social History Social History   Tobacco Use   Smoking status: Every Day    Packs/day: 1.00    Types: Cigarettes   Smokeless tobacco: Never  Substance Use Topics   Alcohol use: Yes   Drug use: Yes    Types: Marijuana    Comment: within the last month    Family History Family History  Problem Relation Age of Onset   Diabetes Mother    Cancer Mother    Cancer Father    Deep vein thrombosis Father    Diabetes Father    Breast cancer Maternal Grandmother    Breast cancer Paternal Grandmother     Allergies  Allergen Reactions   Tramadol     Other reaction(s): Headache   Ativan [Lorazepam] Anxiety     REVIEW OF SYSTEMS (Negative unless checked)  Constitutional: [] Weight loss  [] Fever  [] Chills Cardiac: [] Chest pain   [] Chest pressure   [] Palpitations   [] Shortness of breath when laying flat   [] Shortness of breath with exertion. Vascular:  [] Pain in legs with walking   [x] Pain in legs at rest  [] History of DVT   [] Phlebitis   [x] Swelling in  legs   [] Varicose veins   [] Non-healing ulcers Pulmonary:   [] Uses home oxygen   [] Productive cough   [] Hemoptysis   [] Wheeze  [] COPD   [] Asthma Neurologic:  [] Dizziness   [] Seizures   [] History of stroke   [] History of TIA  [] Aphasia   [] Vissual changes   [] Weakness or numbness in arm   [] Weakness or numbness in leg Musculoskeletal:   [] Joint swelling   [] Joint pain   [] Low back pain Hematologic:  [] Easy bruising  [] Easy bleeding   [] Hypercoagulable state   [] Anemic Gastrointestinal:  [] Diarrhea   [] Vomiting  [] Gastroesophageal reflux/heartburn   [] Difficulty swallowing. Genitourinary:  [] Chronic kidney disease   [] Difficult urination  [] Frequent urination   [] Blood in urine Skin:  [] Rashes   [] Ulcers  Psychological:  [] History of anxiety   []  History of major depression.  Physical  Examination  There were no vitals filed for this visit. There is no height or weight on file to calculate BMI. Gen: WD/WN, NAD Head: [place]/AT, No temporalis wasting.  Ear/Nose/Throat: Hearing grossly intact, nares w/o erythema or drainage, pinna without lesions Eyes: PER, EOMI, sclera nonicteric.  Neck: Supple, no gross masses.  No JVD.  Pulmonary:  Good air movement, no audible wheezing, no use of accessory muscles.  Cardiac: RRR, precordium not hyperdynamic. Vascular:  scattered varicosities present bilaterally.  Moderate venous stasis changes to the legs bilaterally.  2+ soft pitting edema  Vessel Right Left  Radial Palpable Palpable  Gastrointestinal: soft, non-distended. No guarding/no peritoneal signs.  Musculoskeletal: M/S 5/5 throughout.  No deformity.  Neurologic: CN 2-12 intact. Pain and light touch intact in extremities.  Symmetrical.  Speech is fluent. Motor exam as listed above. Psychiatric: Judgment intact, Mood & affect appropriate for pt's clinical situation. Dermatologic: Venous rashes no ulcers noted.  No changes consistent with cellulitis. Lymph : No lichenification or skin changes of chronic lymphedema.  CBC Lab Results  Component Value Date   WBC 8.3 11/08/2021   HGB 15.8 (H) 11/08/2021   HCT 48.2 (H) 11/08/2021   MCV 85.6 11/08/2021   PLT 191 11/08/2021    BMET    Component Value Date/Time   NA 138 11/08/2021 1117   NA 136 01/22/2013 0636   K 4.3 11/08/2021 1117   K 3.9 01/22/2013 0636   CL 108 11/08/2021 1117   CL 107 01/22/2013 0636   CO2 24 11/08/2021 1117   CO2 22 01/22/2013 0636   GLUCOSE 90 11/08/2021 1117   GLUCOSE 141 (H) 01/22/2013 0636   BUN 10 11/08/2021 1117   BUN 7 01/22/2013 0636   CREATININE 0.87 11/08/2021 1117   CREATININE 0.80 01/27/2013 1314   CALCIUM 9.1 11/08/2021 1117   CALCIUM 8.2 (L) 01/22/2013 0636   GFRNONAA >60 11/08/2021 1117   GFRNONAA >60 01/27/2013 1314   GFRAA >60 01/27/2013 1314   CrCl cannot be calculated  (Patient's most recent lab result is older than the maximum 21 days allowed.).  COAG Lab Results  Component Value Date   INR 1.1 11/08/2021   INR 3.3 (H) 09/12/2018   INR 1.9 (H) 08/13/2018    Radiology No results found.   Assessment/Plan 1. History of DVT (deep vein thrombosis) Recommend:   No surgery or intervention at this point in time.  IVC filter is not indicated at present.  Patient's duplex ultrasound of the left leg venous system is very confusing.  Venous duplex dated 11/08/2021 left lower extremity No filling defects to suggest DVT on grayscale or color Doppler imaging. Doppler waveforms  show normal direction of venous flow, normal respiratory plasticity and response to augmentation.  I have personally reviewed the images and do not see the area of concern that is being described.  In fact I concur with the majority of the report that states there is normal compressibility and normal phasic venous flow which does not support DVT.    The patient is initiated on anticoagulation but has chosen Lovenox and feels this can be done indefinitely.  I tried to explain that this is not realistic given the long term side effects of Lovenox.  She is not interested in warfarin.  I did question if we could get a home warfarin monitor it might make this a better option.  Elevation was stressed, such as the use of a recliner.  I have discussed  DVT and post phlebitic changes such as swelling and why it  causes symptoms such as pain.  The patient should wear graduated compression stockings beginning after three full days of anticoagulation.  The compression should be worn on a daily basis. The patient should wearing the stockings first thing in the morning and removing them in the evening. The patient should not to sleep in the stockings.  In addition, behavioral modification including elevation during the day and avoidance of prolonged dependency will be initiated.    The patient will continue  anticoagulation for now as there have not been any problems or complications at this point.   With respect to her pain medication, I still feel that there is another issue as to etiology that needs to be addressed.  Even if the ultrasound were aurate then a small nonocclusive chronic clot would not explain the degree of leg pian she is experiencing and she has had bilateral symptoms in the past.    I have asked that she follow up in 3 months, no studies   2. Pain of left lower extremity See #1  3. Lymphedema Recommend:  No surgery or intervention at this point in time.    I have reviewed my discussion with the patient regarding lymphedema and why it  causes symptoms.  Patient will continue wearing graduated compression on a daily basis. The patient should put the compression on first thing in the morning and removing them in the evening. The patient should not sleep in the compression.   In addition, behavioral modification throughout the day will be continued.  This will include frequent elevation (such as in a recliner), use of over the counter pain medications as needed and exercise such as walking.  The systemic causes for chronic edema such as liver, kidney and cardiac etiologies does not appear to have significant changed over the past year.    The patient will continue aggressive use of the  lymph pump.  This will continue to improve the edema control and prevent sequela such as ulcers and infections.      Levora Dredge, MD  01/08/2022 3:39 PM

## 2022-01-09 ENCOUNTER — Encounter (INDEPENDENT_AMBULATORY_CARE_PROVIDER_SITE_OTHER): Payer: Self-pay | Admitting: Vascular Surgery

## 2022-01-09 ENCOUNTER — Ambulatory Visit (INDEPENDENT_AMBULATORY_CARE_PROVIDER_SITE_OTHER): Payer: Self-pay | Admitting: Vascular Surgery

## 2022-01-09 VITALS — BP 137/82 | HR 74 | Ht 68.0 in | Wt 239.0 lb

## 2022-01-09 DIAGNOSIS — M79605 Pain in left leg: Secondary | ICD-10-CM

## 2022-01-09 DIAGNOSIS — I890 Lymphedema, not elsewhere classified: Secondary | ICD-10-CM

## 2022-01-09 DIAGNOSIS — Z86718 Personal history of other venous thrombosis and embolism: Secondary | ICD-10-CM

## 2022-01-14 ENCOUNTER — Other Ambulatory Visit (INDEPENDENT_AMBULATORY_CARE_PROVIDER_SITE_OTHER): Payer: Self-pay | Admitting: Nurse Practitioner

## 2022-01-15 ENCOUNTER — Encounter (INDEPENDENT_AMBULATORY_CARE_PROVIDER_SITE_OTHER): Payer: Self-pay | Admitting: Vascular Surgery

## 2022-01-25 ENCOUNTER — Telehealth (INDEPENDENT_AMBULATORY_CARE_PROVIDER_SITE_OTHER): Payer: Self-pay | Admitting: Nurse Practitioner

## 2022-01-25 ENCOUNTER — Other Ambulatory Visit (INDEPENDENT_AMBULATORY_CARE_PROVIDER_SITE_OTHER): Payer: Self-pay | Admitting: Nurse Practitioner

## 2022-01-25 NOTE — Telephone Encounter (Signed)
Refill sent.

## 2022-01-25 NOTE — Telephone Encounter (Signed)
Patient called and was not sure if she is supposed to have her enoxaparin (LOVENOX) 100 MG/ML injection [[id number]] refilled.  If so, she states she only have one shot left for tonight.  If she needs a refilled, she wants it sent to [facility] on [address]. Please advise.

## 2022-01-25 NOTE — Telephone Encounter (Signed)
Patient made aware.

## 2022-01-31 ENCOUNTER — Other Ambulatory Visit (INDEPENDENT_AMBULATORY_CARE_PROVIDER_SITE_OTHER): Payer: Self-pay | Admitting: Nurse Practitioner

## 2022-01-31 MED ORDER — ENOXAPARIN SODIUM 100 MG/ML IJ SOSY: PREFILLED_SYRINGE | INTRAMUSCULAR | 0 refills | Status: DC

## 2022-01-31 NOTE — Telephone Encounter (Signed)
Patient's pharmacy at [facility] called back wanting the prescription for her blood thinner to be e-faxed again as it did not come through.

## 2022-01-31 NOTE — Telephone Encounter (Signed)
Left message on patient voicemail that rx has been sent to pharmacy

## 2022-01-31 NOTE — Telephone Encounter (Signed)
Patient was made aware that medication was sent to pharmacy through [facility]. Patient verbalized that she will contact pharmacy and will give a call back if refill was not available.

## 2022-01-31 NOTE — Telephone Encounter (Signed)
Patient called again and left message stating there was supposed to be a prescription called in at [facility] and it was not there.  Please advise.

## 2022-04-11 NOTE — Progress Notes (Deleted)
MRN : [id number]  Colleen Campos is a 52 y.o. (08-27-1970) female who presents with chief complaint of legs hurt and swell.  History of Present Illness:   The patient returns to the office for followup evaluation regarding leg swelling and chronic pain.  She was last seen 01/09/2022.  The swelling has persisted and the pain associated with swelling continues and she is requesting pain medication. There have not been any interval development of a ulcerations or wounds.   She was seen in the ER 11/08/2021 for the acute onset of pain and swelling in the left leg, on going at that time for 2-3 weeks.   She had stopped her anticoagulation several months ago because of cost.  Duplex ultrasound was done (see below) and she was started on Lovenox at her request again secondary to cost and the fact that "Coumadin doesn't work for me".   The patient also states elevation during the day is being done too.   Her life has been filled with multiple stressors and so she has not been using her pump much.   With respect to her pain she notes it is bilateral and continuous.  The pain is in the entire leg and now goes all the way to the hip.  She still has not had any evaluation of her back yet, she states she can't afford it.   Venous duplex dated 11/08/2021 left lower extremity No filling defects to suggest DVT on grayscale or color Doppler imaging. Doppler waveforms show normal direction of venous flow, normal respiratory plasticity and response to augmentation.  I have personally reviewed the images and do not see the area of concern that is being described.  In fact I concur with the majority of the report that states there is normal compressibility and normal phasic venous flow.    No outpatient medications have been marked as taking for the 04/13/22 encounter (Appointment) with Delana Meyer, Dolores Lory, MD.    Past Medical History:  Diagnosis Date   Anxiety    Bipolar 1 disorder ([place])    History  of blood clots    PTSD (post-traumatic stress disorder)     Past Surgical History:  Procedure Laterality Date   BREAST BIOPSY     CESAREAN SECTION     DILATION AND CURETTAGE OF UTERUS      Social History Social History   Tobacco Use   Smoking status: Every Day    Packs/day: 1.00    Types: Cigarettes    Passive exposure: Never   Smokeless tobacco: Never  Substance Use Topics   Alcohol use: Yes   Drug use: Yes    Types: Marijuana    Comment: within the last month    Family History Family History  Problem Relation Age of Onset   Diabetes Mother    Cancer Mother    Cancer Father    Deep vein thrombosis Father    Diabetes Father    Breast cancer Maternal Grandmother    Breast cancer Paternal Grandmother     Allergies  Allergen Reactions   Tramadol     Other reaction(s): Headache   Ativan [Lorazepam] Anxiety     REVIEW OF SYSTEMS (Negative unless checked)  Constitutional: '[]'$ Weight loss  '[]'$ Fever  '[]'$ Chills Cardiac: '[]'$ Chest pain   '[]'$ Chest pressure   '[]'$ Palpitations   '[]'$ Shortness of breath when laying flat   '[]'$ Shortness of breath with exertion. Vascular:  '[]'$ Pain in legs with walking   [  x]Pain in legs at rest  '[]'$ History of DVT   '[]'$ Phlebitis   '[x]'$ Swelling in legs   '[]'$ Varicose veins   '[]'$ Non-healing ulcers Pulmonary:   '[]'$ Uses home oxygen   '[]'$ Productive cough   '[]'$ Hemoptysis   '[]'$ Wheeze  '[]'$ COPD   '[]'$ Asthma Neurologic:  '[]'$ Dizziness   '[]'$ Seizures   '[]'$ History of stroke   '[]'$ History of TIA  '[]'$ Aphasia   '[]'$ Vissual changes   '[]'$ Weakness or numbness in arm   '[]'$ Weakness or numbness in leg Musculoskeletal:   '[]'$ Joint swelling   '[]'$ Joint pain   '[]'$ Low back pain Hematologic:  '[]'$ Easy bruising  '[]'$ Easy bleeding   '[]'$ Hypercoagulable state   '[]'$ Anemic Gastrointestinal:  '[]'$ Diarrhea   '[]'$ Vomiting  '[]'$ Gastroesophageal reflux/heartburn   '[]'$ Difficulty swallowing. Genitourinary:  '[]'$ Chronic kidney disease   '[]'$ Difficult urination  '[]'$ Frequent urination   '[]'$ Blood in urine Skin:  '[]'$ Rashes   '[]'$ Ulcers   Psychological:  '[]'$ History of anxiety   '[]'$  History of major depression.  Physical Examination  There were no vitals filed for this visit. There is no height or weight on file to calculate BMI. Gen: WD/WN, NAD Head: [place]/AT, No temporalis wasting.  Ear/Nose/Throat: Hearing grossly intact, nares w/o erythema or drainage, pinna without lesions Eyes: PER, EOMI, sclera nonicteric.  Neck: Supple, no gross masses.  No JVD.  Pulmonary:  Good air movement, no audible wheezing, no use of accessory muscles.  Cardiac: RRR, precordium not hyperdynamic. Vascular:  scattered varicosities present bilaterally.  Moderate venous stasis changes to the legs bilaterally.  2+ soft pitting edema. CEAP [id number]   Vessel Right Left  Radial Palpable Palpable  Gastrointestinal: soft, non-distended. No guarding/no peritoneal signs.  Musculoskeletal: M/S 5/5 throughout.  No deformity.  Neurologic: CN 2-12 intact. Pain and light touch intact in extremities.  Symmetrical.  Speech is fluent. Motor exam as listed above. Psychiatric: Judgment intact, Mood & affect appropriate for pt's clinical situation. Dermatologic: Venous rashes no ulcers noted.  No changes consistent with cellulitis. Lymph : No lichenification or skin changes of chronic lymphedema.  CBC Lab Results  Component Value Date   WBC 8.3 11/08/2021   HGB 15.8 (H) 11/08/2021   HCT 48.2 (H) 11/08/2021   MCV 85.6 11/08/2021   PLT 191 11/08/2021    BMET    Component Value Date/Time   NA 138 11/08/2021 1117   NA 136 01/22/2013 0636   K 4.3 11/08/2021 1117   K 3.9 01/22/2013 0636   CL 108 11/08/2021 1117   CL 107 01/22/2013 0636   CO2 24 11/08/2021 1117   CO2 22 01/22/2013 0636   GLUCOSE 90 11/08/2021 1117   GLUCOSE 141 (H) 01/22/2013 0636   BUN 10 11/08/2021 1117   BUN 7 01/22/2013 0636   CREATININE 0.87 11/08/2021 1117   CREATININE 0.80 01/27/2013 1314   CALCIUM 9.1 11/08/2021 1117   CALCIUM 8.2 (L) 01/22/2013 0636   GFRNONAA >60 11/08/2021  1117   GFRNONAA >60 01/27/2013 1314   GFRAA >60 01/27/2013 1314   CrCl cannot be calculated (Patient's most recent lab result is older than the maximum 21 days allowed.).  COAG Lab Results  Component Value Date   INR 1.1 11/08/2021   INR 3.3 (H) 09/12/2018   INR 1.9 (H) 08/13/2018    Radiology No results found.   Assessment/Plan There are no diagnoses linked to this encounter.   Hortencia Pilar, MD  04/11/2022 1:15 PM

## 2022-04-13 ENCOUNTER — Ambulatory Visit (INDEPENDENT_AMBULATORY_CARE_PROVIDER_SITE_OTHER): Payer: Commercial Managed Care - PPO | Admitting: Vascular Surgery

## 2022-04-13 DIAGNOSIS — I890 Lymphedema, not elsewhere classified: Secondary | ICD-10-CM

## 2022-04-13 DIAGNOSIS — I872 Venous insufficiency (chronic) (peripheral): Secondary | ICD-10-CM

## 2022-04-13 DIAGNOSIS — Z86718 Personal history of other venous thrombosis and embolism: Secondary | ICD-10-CM

## 2022-04-13 DIAGNOSIS — M79604 Pain in right leg: Secondary | ICD-10-CM

## 2022-05-02 IMAGING — CT CT VENOGRAM HEAD
3 of 7 series · 17 of 47 positions shown · IV contrast (APPLIED)
Comparison: Head CT 05/18/2021

CLINICAL DATA: Left-sided headache

EXAM:
CT VENOGRAM HEAD
TECHNIQUE: Venographic phase images of the brain were obtained following the
administration of intravenous contrast. Multiplanar reformats and
maximum intensity projections were generated.

[Series 3: ax thin · axial · 0.32mm/px · z∈[+284,+428]mm · 11 of 164 slices shown]
[im 10/164  brain]
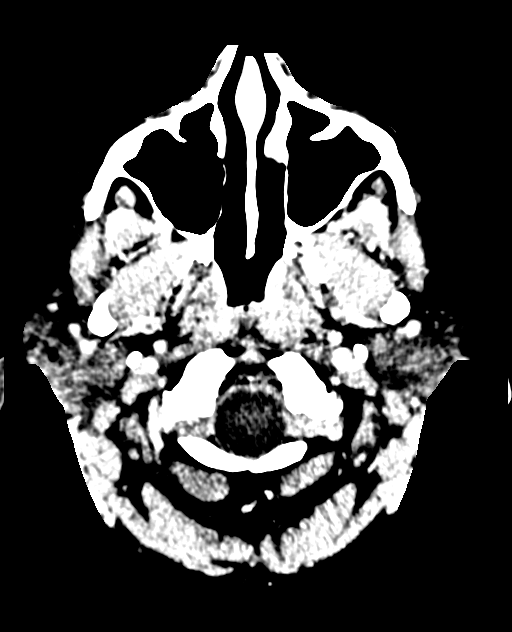
[im 29/164  bone]
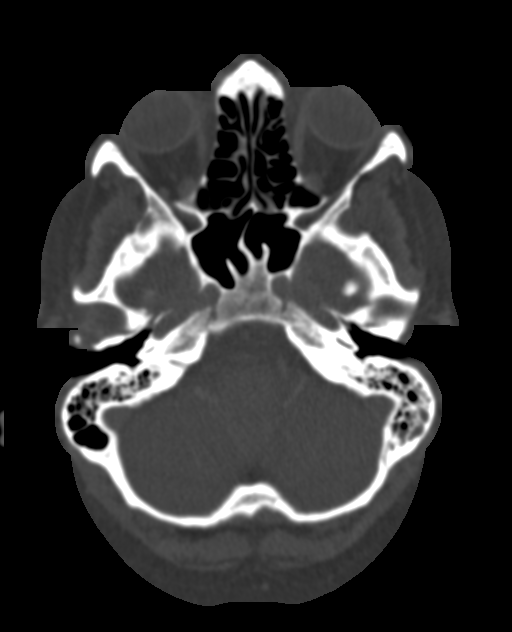
[im 39/164  brain]
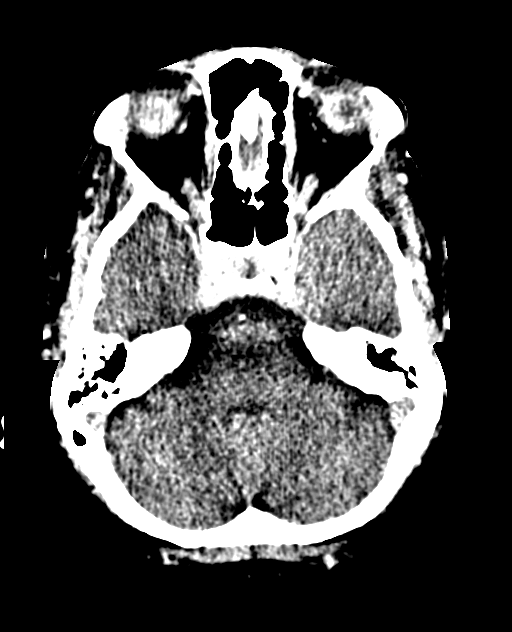
[im 58/164  bone]
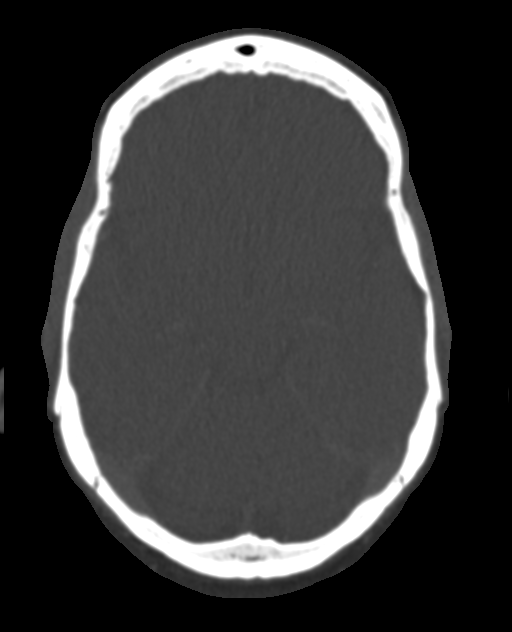
[im 68/164  brain]
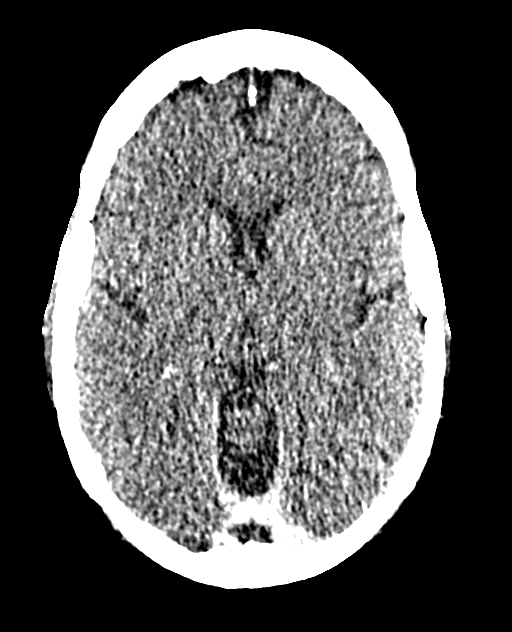
[im 87/164  bone]
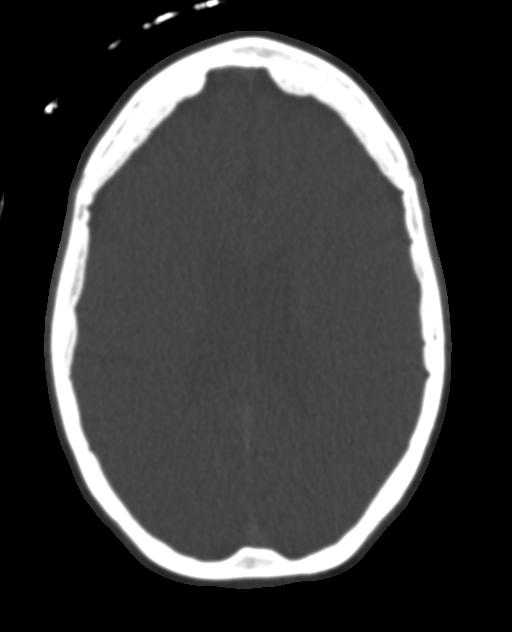
[im 96/164  brain]
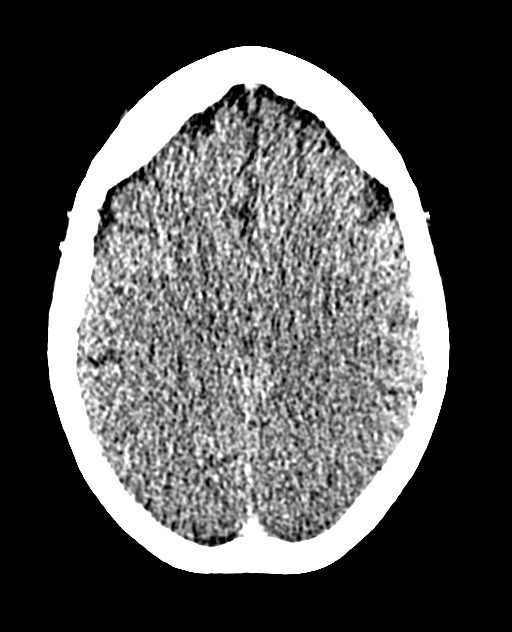
[im 106/164  bone]
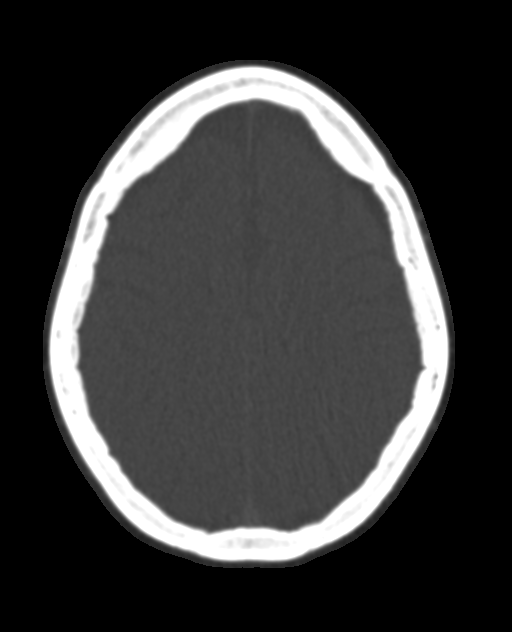
[im 125/164  brain]
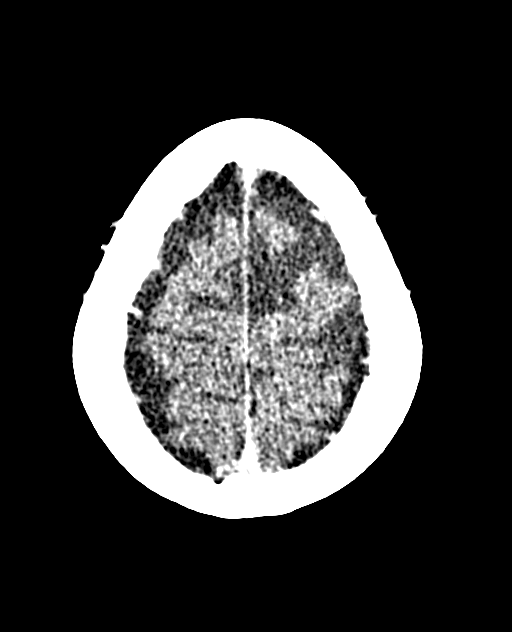
[im 135/164  bone]
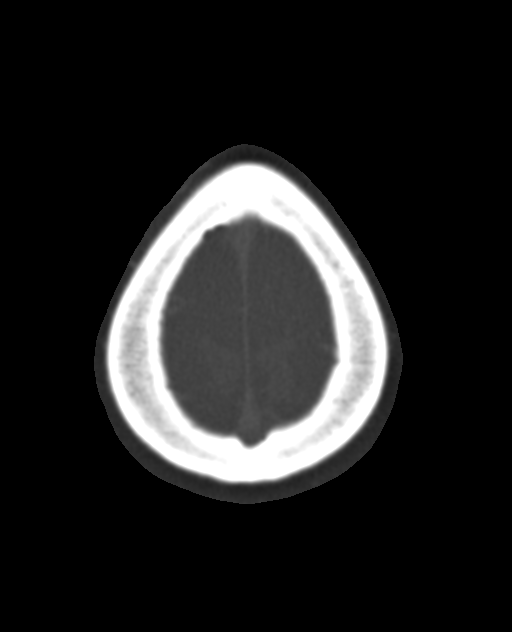
[im 154/164  brain]
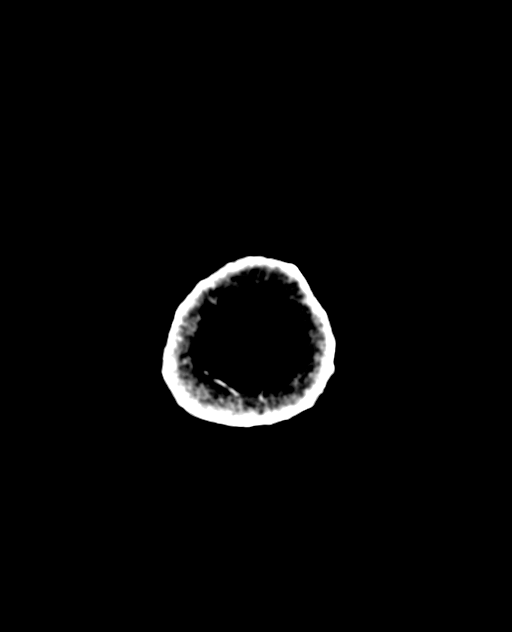

[Series 5: cor thin · coronal · 0.32mm/px · 3 of 206 slices shown]
[im 59/206  brain]
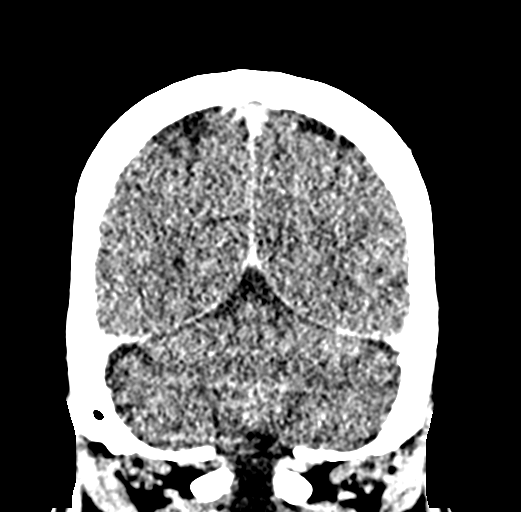
[im 88/206  brain]
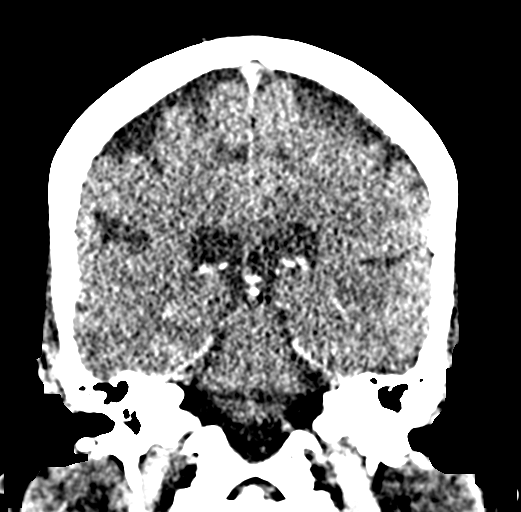
[im 118/206  brain]
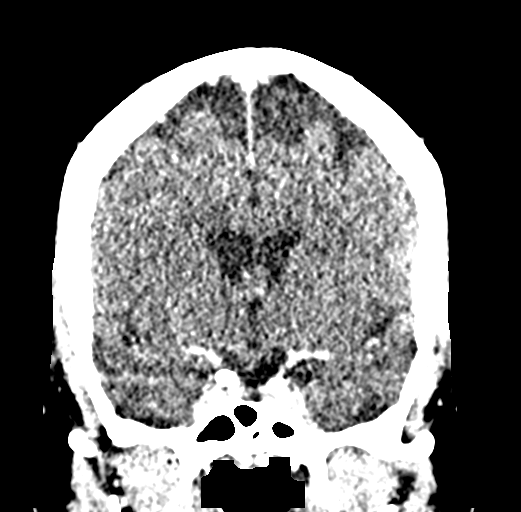

[Series 6: sag thin · sagittal · 0.32mm/px · 3 of 167 slices shown]
[im 34/167  brain]
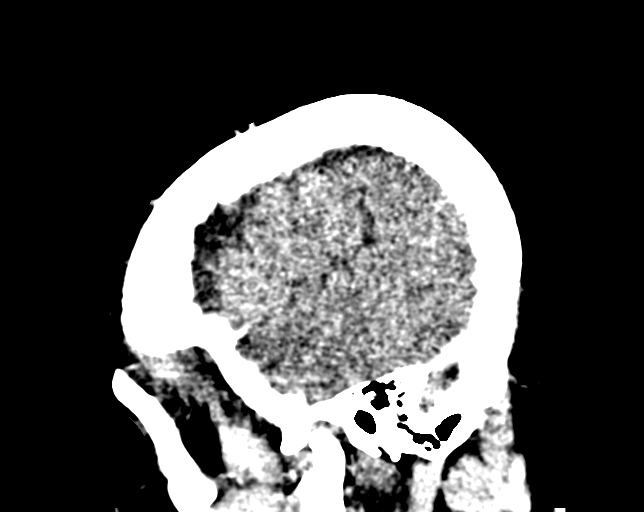
[im 67/167  brain]
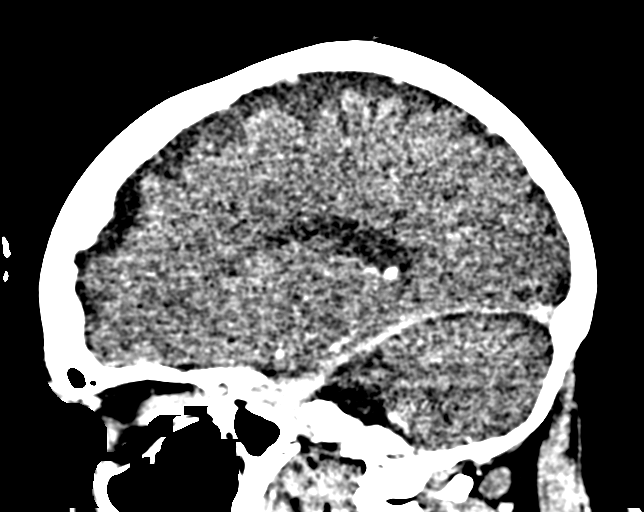
[im 100/167  brain]
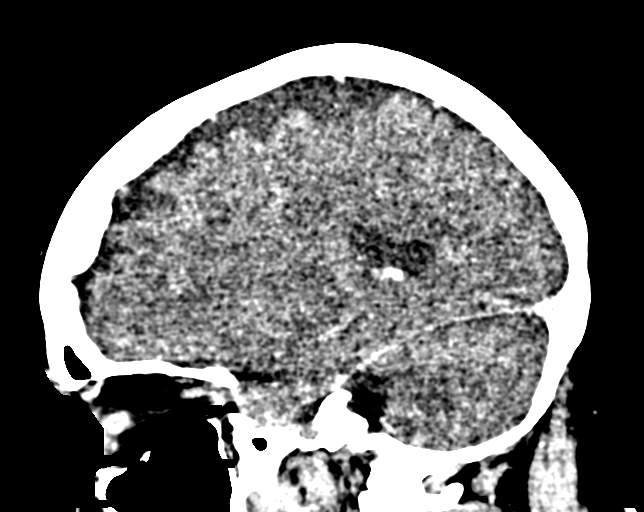

[17 of 47 positions shown; findings below may reference images not displayed]

RADIATION DOSE REDUCTION: This exam was performed according to the
departmental dose-optimization program which includes automated
exposure control, adjustment of the mA and/or kV according to
patient size and/or use of iterative reconstruction technique.

CONTRAST:  75mL OMNIPAQUE IOHEXOL 350 MG/ML SOLN
FINDINGS: Superior sagittal sinus: Normal.

Straight sinus: Normal.

Inferior sagittal sinus, vein of [place] and internal cerebral veins:
Normal.

Transverse sinuses: Normal.

Sigmoid sinuses: Normal.

Visualized jugular veins: Normal.
IMPRESSION: Normal CT venogram.

## 2022-05-02 IMAGING — CT CT HEAD W/O CM
4 series · 17 of 47 positions shown, 19 images · non-contrast
Comparison: None.

CLINICAL DATA: Left-sided headache for 1 week



[Series 2: head wo · axial · 0.39mm/px · z∈[-132,-17]mm · 7 of 31 slices shown, 9 images]
[im 4/31  brain]
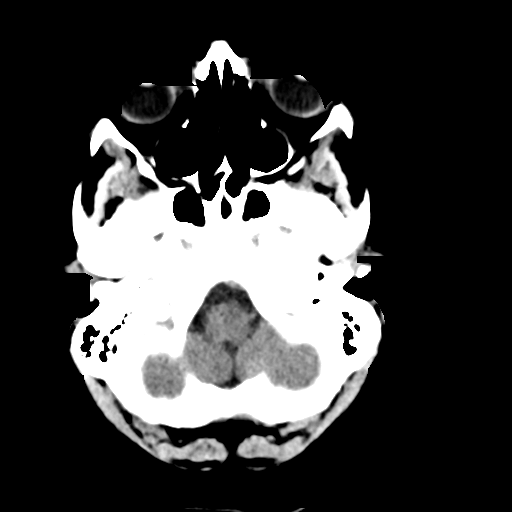
[im 4/31  bone]
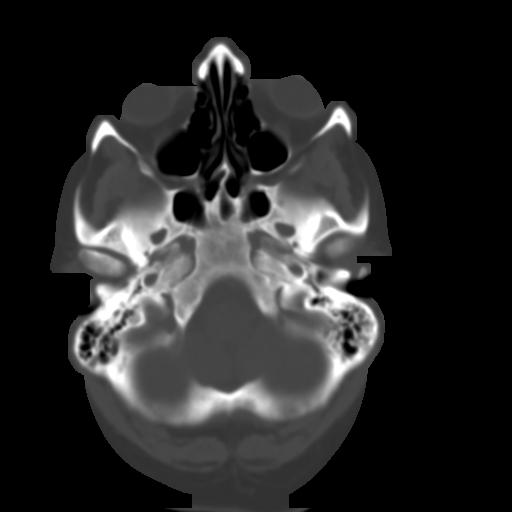
[im 8/31  brain]
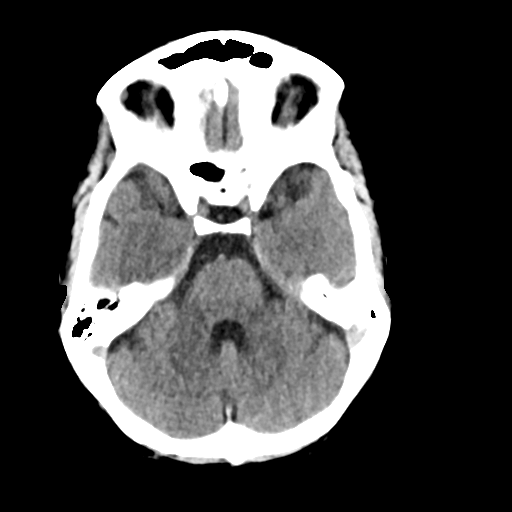
[im 12/31  brain]
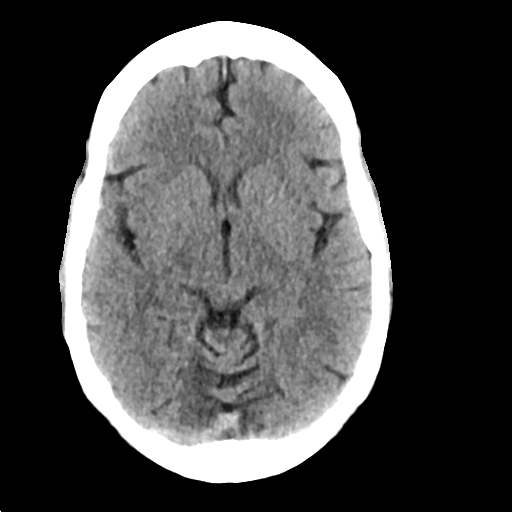
[im 16/31  brain]
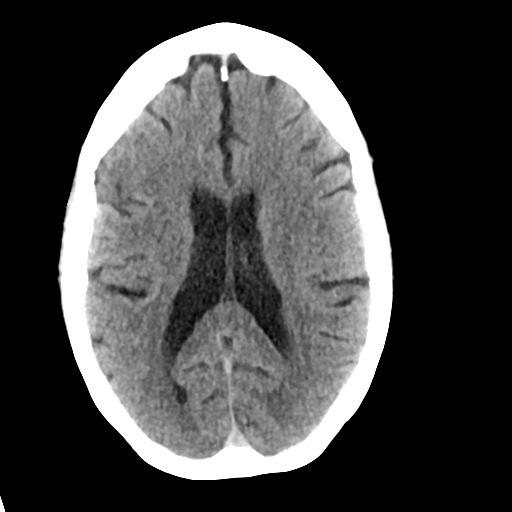
[im 19/31  brain]
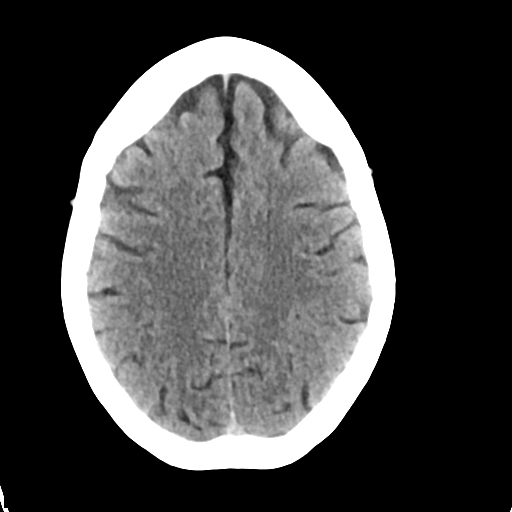
[im 19/31  bone]
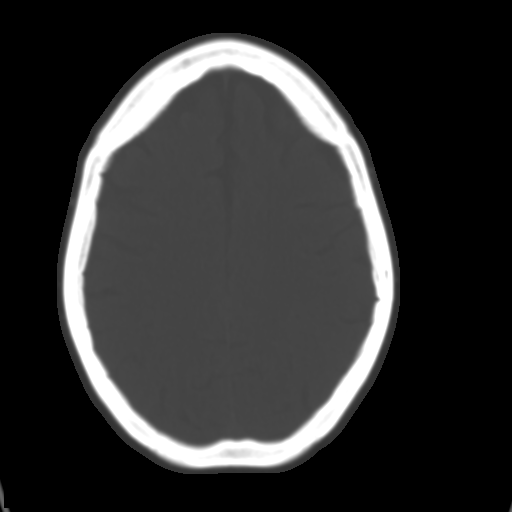
[im 23/31  brain]
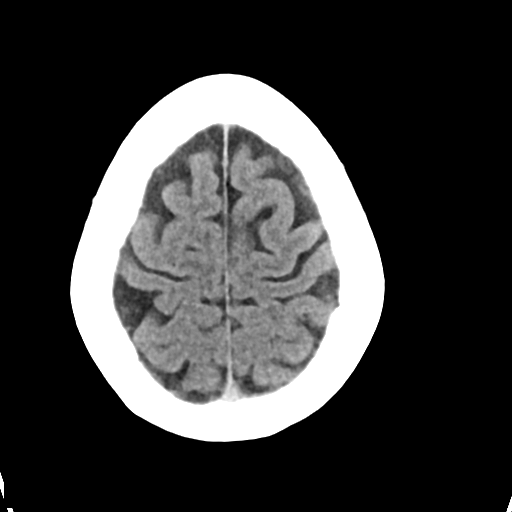
[im 27/31  brain]
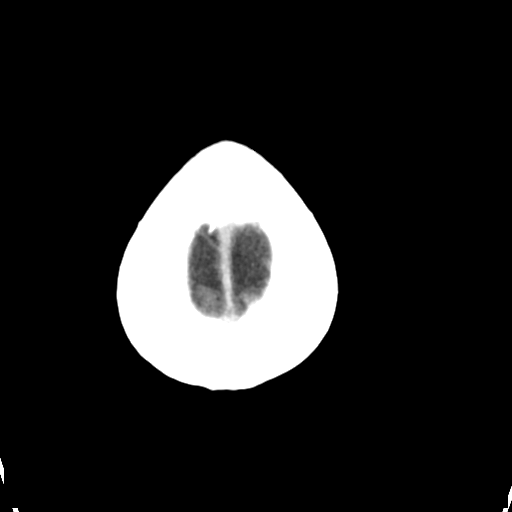

[Series 3: head bone · axial · 0.39mm/px · z∈[-133,-81]mm · 4 of 76 slices shown]
[im 8/76  bone]
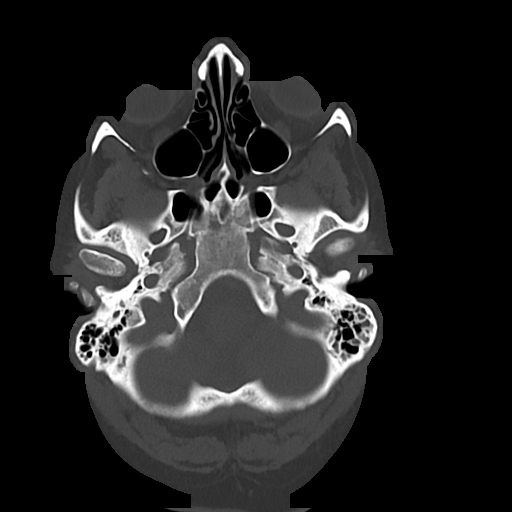
[im 16/76  bone]
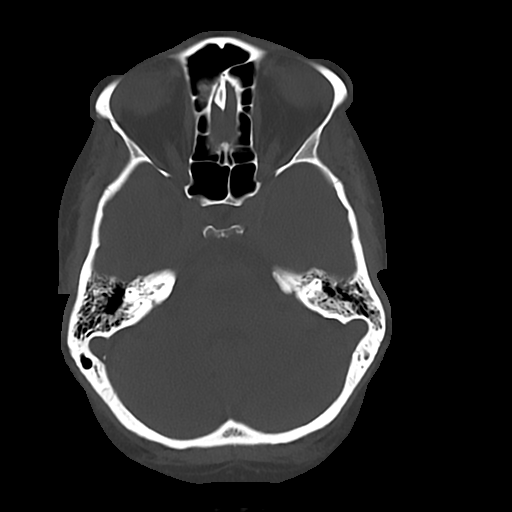
[im 23/76  bone]
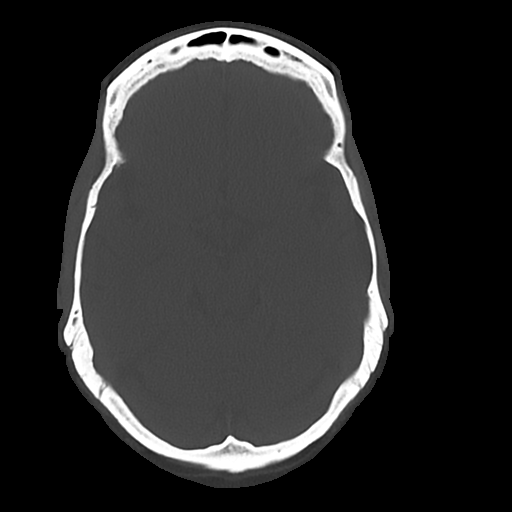
[im 34/76  bone]
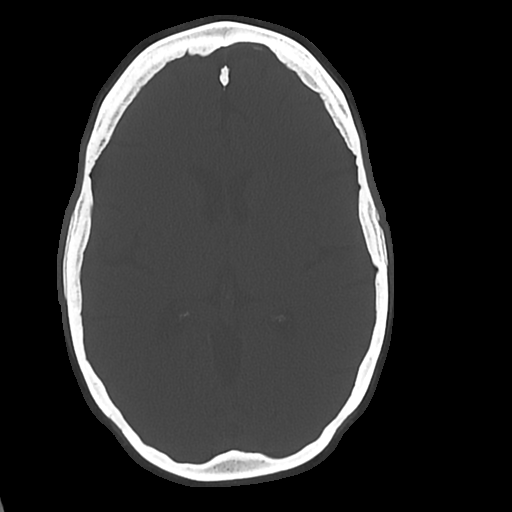

[Series 4: cor soft · coronal · 0.31mm/px · 3 of 65 slices shown]
[im 22/65  brain]
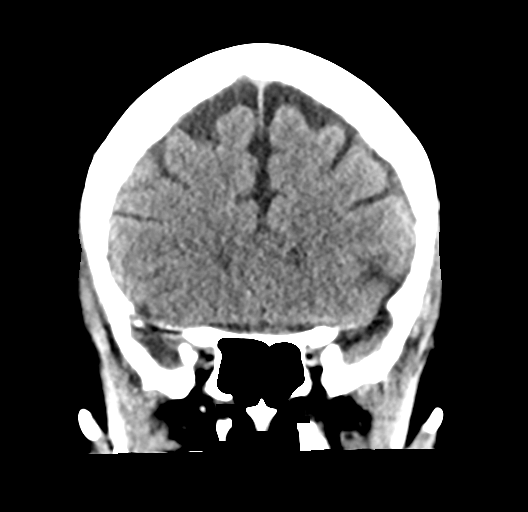
[im 29/65  brain]
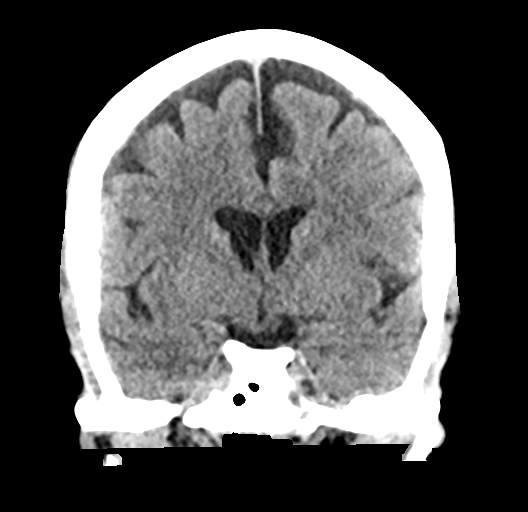
[im 36/65  brain]
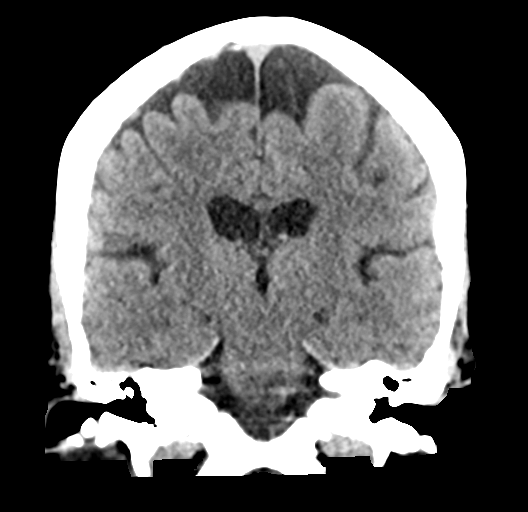

[Series 5: sag soft · sagittal · 0.31mm/px · 3 of 56 slices shown]
[im 19/56  brain]
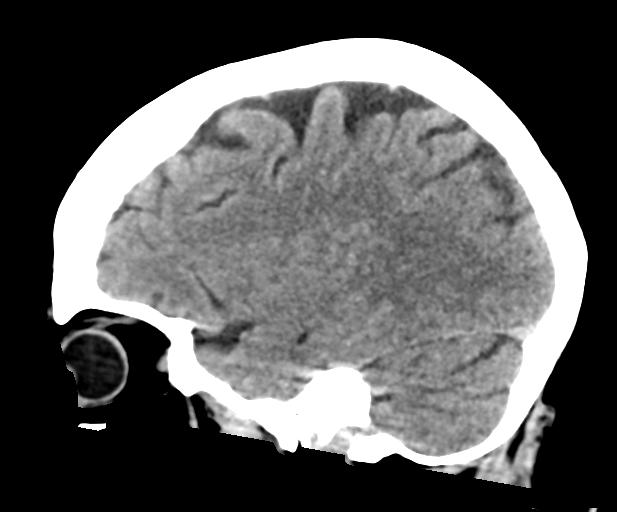
[im 28/56  brain]
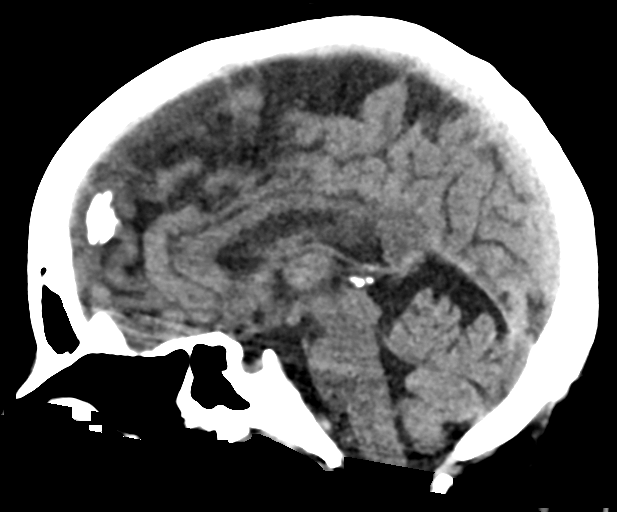
[im 37/56  brain]
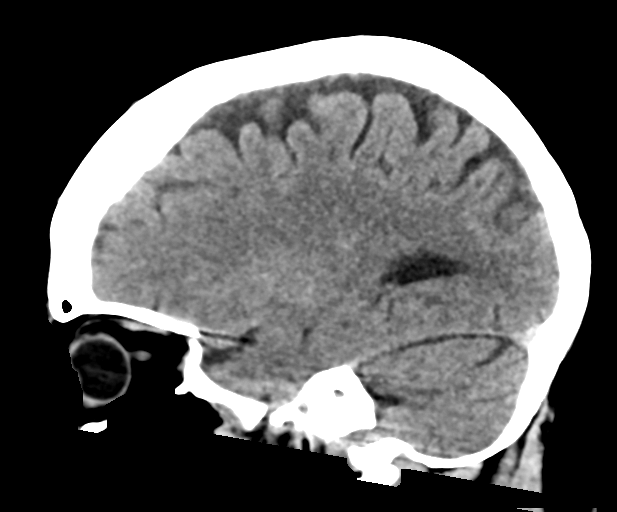

[17 of 47 positions shown; findings below may reference images not displayed]

FINDINGS: Brain: No acute infarct or hemorrhage. Lateral ventricles and
midline structures are unremarkable. No acute extra-axial fluid
collections. No mass effect.

Vascular: No hyperdense vessel or unexpected calcification.

Skull: Normal. Negative for fracture or focal lesion.

Sinuses/Orbits: No acute finding.

Other: None.
IMPRESSION: 1. No acute intracranial process.

## 2022-05-24 NOTE — Progress Notes (Deleted)
MRN : [id number]  Colleen Campos is a 52 y.o. (10-28-1970) female who presents with chief complaint of legs hurt and swell.  History of Present Illness: ***  No outpatient medications have been marked as taking for the 05/25/22 encounter (Appointment) with [name], [name], MD.    Past Medical History:  Diagnosis Date   Anxiety    Bipolar 1 disorder (HCC)    History of blood clots    PTSD (post-traumatic stress disorder)     Past Surgical History:  Procedure Laterality Date   BREAST BIOPSY     CESAREAN SECTION     DILATION AND CURETTAGE OF UTERUS      Social History Social History   Tobacco Use   Smoking status: Every Day    Packs/day: 1    Types: Cigarettes    Passive exposure: Never   Smokeless tobacco: Never  Substance Use Topics   Alcohol use: Yes   Drug use: Yes    Types: Marijuana    Comment: within the last month    Family History Family History  Problem Relation Age of Onset   Diabetes Mother    Cancer Mother    Cancer Father    Deep vein thrombosis Father    Diabetes Father    Breast cancer Maternal Grandmother    Breast cancer Paternal Grandmother     Allergies  Allergen Reactions   Tramadol     Other reaction(s): Headache   Ativan [Lorazepam] Anxiety     REVIEW OF SYSTEMS (Negative unless checked)  Constitutional: Weight loss  Fever  Chills Cardiac: Chest pain   Chest pressure   Palpitations   Shortness of breath when laying flat   Shortness of breath with exertion. Vascular:  Pain in legs with walking   Pain in legs at rest  History of DVT   Phlebitis   Swelling in legs   Varicose veins   Non-healing ulcers Pulmonary:   Uses home oxygen   Productive cough   Hemoptysis   Wheeze  COPD   Asthma Neurologic:  Dizziness   Seizures   History of stroke   History of TIA  Aphasia   Vissual changes   Weakness or numbness in arm   Weakness or numbness in leg Musculoskeletal:    Joint swelling   Joint pain   Low back pain Hematologic:  Easy bruising  Easy bleeding   Hypercoagulable state   Anemic Gastrointestinal:  Diarrhea   Vomiting  Gastroesophageal reflux/heartburn   Difficulty swallowing. Genitourinary:  Chronic kidney disease   Difficult urination  Frequent urination   Blood in urine Skin:  Rashes   Ulcers  Psychological:  History of anxiety    History of major depression.  Physical Examination  There were no vitals filed for this visit. There is no height or weight on file to calculate BMI. Gen: WD/WN, NAD Head: [place]/AT, No temporalis wasting.  Ear/Nose/Throat: Hearing grossly intact, nares w/o erythema or drainage, pinna without lesions Eyes: PER, EOMI, sclera nonicteric.  Neck: Supple, no gross masses.  No JVD.  Pulmonary:  Good air movement, no audible wheezing, no use of accessory muscles.  Cardiac: RRR, precordium not hyperdynamic. Vascular:  scattered varicosities present bilaterally.  Moderate venous stasis changes to the legs bilaterally.  2+ soft pitting edema. CEAP [id number]   Vessel Right Left  Radial Palpable Palpable  Gastrointestinal: soft, non-distended. No guarding/no peritoneal signs.  Musculoskeletal: M/S 5/5 throughout.  No  deformity.  Neurologic: CN 2-12 intact. Pain and light touch intact in extremities.  Symmetrical.  Speech is fluent. Motor exam as listed above. Psychiatric: Judgment intact, Mood & affect appropriate for pt's clinical situation. Dermatologic: Venous rashes no ulcers noted.  No changes consistent with cellulitis. Lymph : No lichenification or skin changes of chronic lymphedema.  CBC Lab Results  Component Value Date   WBC 8.3 11/08/2021   HGB 15.8 (H) 11/08/2021   HCT 48.2 (H) 11/08/2021   MCV 85.6 11/08/2021   PLT 191 11/08/2021    BMET    Component Value Date/Time   NA 138 11/08/2021 1117   NA 136 01/22/2013 0636   K 4.3 11/08/2021 1117   K 3.9 01/22/2013 0636    CL 108 11/08/2021 1117   CL 107 01/22/2013 0636   CO2 24 11/08/2021 1117   CO2 22 01/22/2013 0636   GLUCOSE 90 11/08/2021 1117   GLUCOSE 141 (H) 01/22/2013 0636   BUN 10 11/08/2021 1117   BUN 7 01/22/2013 0636   CREATININE 0.87 11/08/2021 1117   CREATININE 0.80 01/27/2013 1314   CALCIUM 9.1 11/08/2021 1117   CALCIUM 8.2 (L) 01/22/2013 0636   GFRNONAA >60 11/08/2021 1117   GFRNONAA >60 01/27/2013 1314   GFRAA >60 01/27/2013 1314   CrCl cannot be calculated (Patient's most recent lab result is older than the maximum 21 days allowed.).  COAG Lab Results  Component Value Date   INR 1.1 11/08/2021   INR 3.3 (H) 09/12/2018   INR 1.9 (H) 08/13/2018    Radiology No results found.   Assessment/Plan There are no diagnoses linked to this encounter.   Levora Dredge, MD  05/24/2022 8:45 AM

## 2022-05-25 ENCOUNTER — Ambulatory Visit (INDEPENDENT_AMBULATORY_CARE_PROVIDER_SITE_OTHER): Payer: Commercial Managed Care - PPO | Admitting: Vascular Surgery

## 2022-05-25 DIAGNOSIS — Z86718 Personal history of other venous thrombosis and embolism: Secondary | ICD-10-CM

## 2022-05-25 DIAGNOSIS — I872 Venous insufficiency (chronic) (peripheral): Secondary | ICD-10-CM

## 2022-05-25 DIAGNOSIS — M79604 Pain in right leg: Secondary | ICD-10-CM

## 2022-05-25 DIAGNOSIS — I890 Lymphedema, not elsewhere classified: Secondary | ICD-10-CM

## 2023-04-06 ENCOUNTER — Other Ambulatory Visit: Payer: Self-pay

## 2023-04-06 ENCOUNTER — Emergency Department
Admission: EM | Admit: 2023-04-06 | Discharge: 2023-04-06 | Disposition: A | Discharge status: Home / Self Care | Payer: Self-pay | Attending: Emergency Medicine | Admitting: Emergency Medicine

## 2023-04-06 ENCOUNTER — Emergency Department: Payer: Self-pay

## 2023-04-06 DIAGNOSIS — M545 Low back pain, unspecified: Secondary | ICD-10-CM

## 2023-04-06 DIAGNOSIS — S300XXA Contusion of lower back and pelvis, initial encounter: Secondary | ICD-10-CM | POA: Insufficient documentation

## 2023-04-06 MED ORDER — CYCLOBENZAPRINE HCL 10 MG PO TABS
5.0000 mg | ORAL_TABLET | Freq: Once | ORAL | Status: AC
  Administered 2023-04-06: 5 mg via ORAL
  Filled 2023-04-06: qty 1

## 2023-04-06 MED ORDER — CYCLOBENZAPRINE HCL 5 MG PO TABS: ORAL_TABLET | ORAL | 0 refills | Status: AC

## 2023-04-06 MED ORDER — KETOROLAC TROMETHAMINE 60 MG/2ML IM SOLN
30.0000 mg | Freq: Once | INTRAMUSCULAR | Status: AC
  Administered 2023-04-06: 30 mg via INTRAMUSCULAR
  Filled 2023-04-06: qty 2

## 2023-04-06 MED ORDER — OXYCODONE-ACETAMINOPHEN 5-325 MG PO TABS: 1.0000 | ORAL_TABLET | ORAL | 0 refills | Status: DC | PRN

## 2023-04-06 MED ORDER — OXYCODONE-ACETAMINOPHEN 5-325 MG PO TABS
1.0000 | ORAL_TABLET | Freq: Once | ORAL | Status: AC
  Administered 2023-04-06: 1 via ORAL
  Filled 2023-04-06: qty 1
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
## 2023-04-06 NOTE — Discharge Instructions (Signed)
 You may take ibuprofen as needed for pain; you may take stronger pain medicine and muscle relaxer as needed.  Return to the ER for worsening symptoms, persistent vomiting, difficulty breathing or other concerns.

## 2023-06-27 ENCOUNTER — Other Ambulatory Visit: Payer: Self-pay | Admitting: Family Medicine

## 2023-06-27 DIAGNOSIS — Z1231 Encounter for screening mammogram for malignant neoplasm of breast: Secondary | ICD-10-CM

## 2023-06-30 LAB — COLOGUARD: COLOGUARD: POSITIVE — AB

## 2023-07-25 ENCOUNTER — Ambulatory Visit
Admission: RE | Admit: 2023-07-25 | Discharge: 2023-07-25 | Disposition: A | Discharge status: Home / Self Care | Payer: Self-pay | Source: Ambulatory Visit | Attending: Family Medicine | Admitting: Family Medicine

## 2023-07-25 DIAGNOSIS — Z1231 Encounter for screening mammogram for malignant neoplasm of breast: Secondary | ICD-10-CM | POA: Insufficient documentation

## 2023-07-30 ENCOUNTER — Other Ambulatory Visit: Payer: Self-pay | Admitting: Family Medicine

## 2023-07-30 DIAGNOSIS — R928 Other abnormal and inconclusive findings on diagnostic imaging of breast: Secondary | ICD-10-CM

## 2023-08-01 ENCOUNTER — Ambulatory Visit
Admission: RE | Admit: 2023-08-01 | Discharge: 2023-08-01 | Disposition: A | Discharge status: Home / Self Care | Payer: Self-pay | Source: Ambulatory Visit | Attending: Family Medicine | Admitting: Family Medicine

## 2023-08-01 DIAGNOSIS — R928 Other abnormal and inconclusive findings on diagnostic imaging of breast: Secondary | ICD-10-CM | POA: Insufficient documentation

## 2023-08-02 ENCOUNTER — Other Ambulatory Visit: Payer: Self-pay | Admitting: Family Medicine

## 2023-08-02 DIAGNOSIS — R928 Other abnormal and inconclusive findings on diagnostic imaging of breast: Secondary | ICD-10-CM

## 2023-08-06 ENCOUNTER — Other Ambulatory Visit: Payer: Self-pay | Admitting: Family Medicine

## 2023-08-06 DIAGNOSIS — R928 Other abnormal and inconclusive findings on diagnostic imaging of breast: Secondary | ICD-10-CM

## 2023-08-08 ENCOUNTER — Ambulatory Visit
Admission: RE | Admit: 2023-08-08 | Discharge: 2023-08-08 | Disposition: A | Discharge status: Home / Self Care | Payer: Self-pay | Source: Ambulatory Visit | Attending: Family Medicine | Admitting: Family Medicine

## 2023-08-08 ENCOUNTER — Other Ambulatory Visit: Payer: Self-pay | Admitting: Diagnostic Radiology

## 2023-08-08 DIAGNOSIS — N62 Hypertrophy of breast: Secondary | ICD-10-CM | POA: Insufficient documentation

## 2023-08-08 DIAGNOSIS — R928 Other abnormal and inconclusive findings on diagnostic imaging of breast: Secondary | ICD-10-CM

## 2023-08-08 DIAGNOSIS — N6022 Fibroadenosis of left breast: Secondary | ICD-10-CM | POA: Insufficient documentation

## 2023-08-08 DIAGNOSIS — R921 Mammographic calcification found on diagnostic imaging of breast: Secondary | ICD-10-CM | POA: Insufficient documentation

## 2023-08-08 DIAGNOSIS — N6001 Solitary cyst of right breast: Secondary | ICD-10-CM | POA: Insufficient documentation

## 2023-08-08 HISTORY — PX: BREAST BIOPSY: SHX20

## 2023-08-08 MED ORDER — LIDOCAINE-EPINEPHRINE 1 %-1:100000 IJ SOLN
8.0000 mL | Freq: Once | INTRAMUSCULAR | Status: AC
Start: 2023-08-08 — End: 2023-08-08
  Administered 2023-08-08: 8 mL
  Filled 2023-08-08: qty 8

## 2023-08-08 MED ORDER — LIDOCAINE 1 % OPTIME INJ - NO CHARGE
3.0000 mL | Freq: Once | INTRAMUSCULAR | Status: AC
  Administered 2023-08-08: 3 mL
  Filled 2023-08-08: qty 4

## 2023-08-08 MED ORDER — LIDOCAINE-EPINEPHRINE 1 %-1:100000 IJ SOLN
8.0000 mL | Freq: Once | INTRAMUSCULAR | Status: AC
  Administered 2023-08-08: 8 mL
  Filled 2023-08-08: qty 8

## 2023-08-08 MED ORDER — LIDOCAINE HCL 1 % IJ SOLN
5.0000 mL | Freq: Once | INTRAMUSCULAR | Status: AC
  Administered 2023-08-08: 5 mL
  Filled 2023-08-08: qty 5

## 2023-08-09 LAB — SURGICAL PATHOLOGY

## 2023-09-13 ENCOUNTER — Encounter: Payer: Self-pay | Admitting: Gastroenterology

## 2023-09-14 ENCOUNTER — Encounter: Payer: Self-pay | Admitting: Gastroenterology

## 2023-09-14 NOTE — Anesthesia Preprocedure Evaluation (Signed)
 Anesthesia Evaluation  Patient identified by MRN, date of birth, ID band Patient awake    Reviewed: Allergy & Precautions, H&P , NPO status , Patient's Chart, lab work & pertinent test results  Airway Mallampati: III  TM Distance: >3 FB Neck ROM: Full    Dental no notable dental hx.    Pulmonary Current Smoker   Pulmonary exam normal breath sounds clear to auscultation       Cardiovascular negative cardio ROS Normal cardiovascular exam Rhythm:Regular Rate:Normal     Neuro/Psych  PSYCHIATRIC DISORDERS Anxiety  Bipolar Disorder   negative neurological ROS  negative psych ROS   GI/Hepatic negative GI ROS, Neg liver ROS,,,  Endo/Other  negative endocrine ROS    Renal/GU negative Renal ROS  negative genitourinary   Musculoskeletal negative musculoskeletal ROS (+) Arthritis ,    Abdominal   Peds negative pediatric ROS (+)  Hematology negative hematology ROS (+)   Anesthesia Other Findings Note patient on Alprazolam 1 mg daily Patient was on Lovenox 100 mg daily, but apparently no longer on it.  History of blood clots  Bipolar 1 disorder (HCC) Anxiety  PTSD (post-traumatic stress disorder) Arthritis  Bronchitis Acute deep vein thrombosis (DVT) of proximal vein of left lower extremity (HCC) Chronic venous insufficiency Lymphedema  History of DVT (deep vein thrombosis) Chronic low back pain Smoke    Reproductive/Obstetrics negative OB ROS                              Anesthesia Physical Anesthesia Plan  ASA: 3  Anesthesia Plan: General   Post-op Pain Management:    Induction: Intravenous  PONV Risk Score and Plan:   Airway Management Planned: Natural Airway and Nasal Cannula  Additional Equipment:   Intra-op Plan:   Post-operative Plan:   Informed Consent: I have reviewed the patients History and Physical, chart, labs and discussed the procedure including the risks,  benefits and alternatives for the proposed anesthesia with the patient or authorized representative who has indicated his/her understanding and acceptance.     Dental Advisory Given  Plan Discussed with: Anesthesiologist, CRNA and Surgeon  Anesthesia Plan Comments: (Patient consented for risks of anesthesia including but not limited to:  - adverse reactions to medications - risk of airway placement if required - damage to eyes, teeth, lips or other oral mucosa - nerve damage due to positioning  - sore throat or hoarseness - Damage to heart, brain, nerves, lungs, other parts of body or loss of life  Patient voiced understanding and assent.)         Anesthesia Quick Evaluation

## 2023-09-17 ENCOUNTER — Other Ambulatory Visit: Payer: Self-pay

## 2023-09-17 ENCOUNTER — Encounter: Payer: Self-pay | Admitting: Gastroenterology

## 2023-09-17 ENCOUNTER — Ambulatory Visit
Admission: RE | Admit: 2023-09-17 | Discharge: 2023-09-17 | Disposition: A | Discharge status: Home / Self Care | Payer: Self-pay | Attending: Gastroenterology | Admitting: Gastroenterology

## 2023-09-17 ENCOUNTER — Encounter
Admission: RE | Disposition: A | Discharge status: Home / Self Care | Payer: Self-pay | Source: Home / Self Care | Attending: Gastroenterology

## 2023-09-17 ENCOUNTER — Ambulatory Visit: Payer: Self-pay | Admitting: Anesthesiology

## 2023-09-17 DIAGNOSIS — Z1211 Encounter for screening for malignant neoplasm of colon: Secondary | ICD-10-CM | POA: Insufficient documentation

## 2023-09-17 DIAGNOSIS — R195 Other fecal abnormalities: Secondary | ICD-10-CM | POA: Insufficient documentation

## 2023-09-17 DIAGNOSIS — Z86718 Personal history of other venous thrombosis and embolism: Secondary | ICD-10-CM | POA: Insufficient documentation

## 2023-09-17 DIAGNOSIS — F17210 Nicotine dependence, cigarettes, uncomplicated: Secondary | ICD-10-CM | POA: Insufficient documentation

## 2023-09-17 DIAGNOSIS — D125 Benign neoplasm of sigmoid colon: Secondary | ICD-10-CM | POA: Insufficient documentation

## 2023-09-17 HISTORY — DX: Bronchitis, not specified as acute or chronic: J40

## 2023-09-17 HISTORY — DX: Personal history of other venous thrombosis and embolism: Z86.718

## 2023-09-17 HISTORY — DX: Low back pain, unspecified: M54.50

## 2023-09-17 HISTORY — PX: POLYPECTOMY: SHX149

## 2023-09-17 HISTORY — DX: Lymphedema, not elsewhere classified: I89.0

## 2023-09-17 HISTORY — DX: Nicotine dependence, unspecified, uncomplicated: F17.200

## 2023-09-17 HISTORY — PX: COLONOSCOPY: SHX5424

## 2023-09-17 HISTORY — DX: Venous insufficiency (chronic) (peripheral): I87.2

## 2023-09-17 HISTORY — DX: Unspecified osteoarthritis, unspecified site: M19.90

## 2023-09-17 HISTORY — DX: Acute embolism and thrombosis of unspecified deep veins of left proximal lower extremity: I82.4Y2

## 2023-09-17 SURGERY — COLONOSCOPY
Anesthesia: General

## 2023-09-17 MED ORDER — PROPOFOL 10 MG/ML IV BOLUS
INTRAVENOUS | Status: DC | PRN
  Administered 2023-09-17: 140 ug/kg/min via INTRAVENOUS
  Administered 2023-09-17: 120 mg via INTRAVENOUS

## 2023-09-17 MED ORDER — STERILE WATER FOR IRRIGATION IR SOLN
Status: DC | PRN
  Administered 2023-09-17: 60 mL

## 2023-09-17 MED ORDER — LIDOCAINE HCL (CARDIAC) PF 100 MG/5ML IV SOSY
PREFILLED_SYRINGE | INTRAVENOUS | Status: DC | PRN
  Administered 2023-09-17: 100 mg via INTRAVENOUS

## 2023-09-17 MED ORDER — LACTATED RINGERS IV SOLN: INTRAVENOUS | Status: DC

## 2023-09-17 MED ORDER — SODIUM CHLORIDE 0.9 % IV SOLN: INTRAVENOUS | Status: DC

## 2023-09-17 MED ORDER — GLYCOPYRROLATE 0.2 MG/ML IJ SOLN
INTRAMUSCULAR | Status: DC | PRN
  Administered 2023-09-17: .2 mg via INTRAVENOUS

## 2023-09-17 MED ORDER — STERILE WATER FOR IRRIGATION IR SOLN
Status: DC | PRN
  Administered 2023-09-17: 1

## 2023-09-17 MED ORDER — LIDOCAINE HCL (PF) 2 % IJ SOLN
INTRAMUSCULAR | Status: AC
  Filled 2023-09-17: qty 5

## 2023-09-17 MED ORDER — GLYCOPYRROLATE 0.2 MG/ML IJ SOLN
INTRAMUSCULAR | Status: AC
  Filled 2023-09-17: qty 1

## 2023-09-17 SURGICAL SUPPLY — 9 items
CLIP HMST11XOPN 235X2.8X (MISCELLANEOUS) IMPLANT
ELECTRODE REM PT RTRN 9FT ADLT (ELECTROSURGICAL) IMPLANT
GAUZE SPONGE 4X4 12PLY STRL (GAUZE/BANDAGES/DRESSINGS) IMPLANT
GOWN CVR UNV OPN BCK APRN NK (MISCELLANEOUS) ×4 IMPLANT
KIT PRC NS LF DISP ENDO (KITS) ×2 IMPLANT
MANIFOLD NEPTUNE II (INSTRUMENTS) ×2 IMPLANT
SNARE LASSO HEX 3 IN 1 (INSTRUMENTS) IMPLANT
TRAP ETRAP POLY (MISCELLANEOUS) IMPLANT
WATER STERILE IRR 250ML POUR (IV SOLUTION) ×2 IMPLANT

## 2023-09-17 NOTE — Transfer of Care (Signed)
 Immediate Anesthesia Transfer of Care Note  Patient: Colleen Campos  Procedure(s) Performed: COLONOSCOPY POLYPECTOMY, INTESTINE  Patient Location: PACU  Anesthesia Type: General  Level of Consciousness: awake, alert  and patient cooperative  Airway and Oxygen Therapy: Patient Spontanous Breathing and Patient connected to supplemental oxygen  Post-op Assessment: Post-op Vital signs reviewed, Patient's Cardiovascular Status Stable, Respiratory Function Stable, Patent Airway and No signs of Nausea or vomiting  Post-op Vital Signs: Reviewed and stable  Complications: No notable events documented.

## 2023-09-17 NOTE — H&P (Addendum)
 Corinn JONELLE Brooklyn, MD [facility], DHIP [address]  [place], [place] [address]  Main: [phone] Fax:  [phone] Pager: [phone]   Primary Care Physician:  Valora Agent, MD Primary Gastroenterologist:  Dr. Corinn JONELLE Brooklyn  Pre-Procedure History & Physical: HPI:  Colleen Campos is a 53 y.o. female is here for an colonoscopy.   Past Medical History:  Diagnosis Date   Acute deep vein thrombosis (DVT) of proximal vein of left lower extremity (HCC)    Anxiety    Arthritis    knees   Bipolar 1 disorder (HCC)    Bronchitis    Chronic low back pain    Chronic venous insufficiency    History of blood clots    History of DVT (deep vein thrombosis)    Lymphedema    PTSD (post-traumatic stress disorder)    Smoker     Past Surgical History:  Procedure Laterality Date   BREAST BIOPSY Left 08/08/2023   Stereo bx, ribbon clip, path pending   BREAST BIOPSY Left 08/08/2023   Stereo bx, X-clip, path pending   BREAST BIOPSY Left 08/08/2023   MM LT BREAST BX W LOC DEV 1ST LESION IMAGE BX SPEC STEREO GUIDE 08/08/2023 [facility]   BREAST BIOPSY Left 08/08/2023   MM LT BREAST BX W LOC DEV EA AD LESION IMG BX SPEC STEREO GUIDE 08/08/2023 [facility]   CESAREAN SECTION     DILATION AND CURETTAGE OF UTERUS      Prior to Admission medications   Medication Sig Start Date End Date Taking? Authorizing Provider  albuterol (VENTOLIN HFA) 108 (90 Base) MCG/ACT inhaler SMARTSIG:2 Puff(s) By Mouth 4 Times Daily 11/29/19   [provider]  ALPRAZolam (XANAX) 1 MG tablet Take 2 mg by mouth 4 (four) times daily as needed for anxiety.    [provider]  citalopram (CELEXA) 20 MG tablet Take 20 mg by mouth daily.    [provider]  cyclobenzaprine  (FLEXERIL ) 5 MG tablet 1 tablet every 8 hours as needed for muscle spasms 04/06/23   Sung, Jade J, MD  divalproex (DEPAKOTE ER) 500 MG 24 hr tablet Take 1,000 mg by mouth daily.  04/26/17    [provider]  enoxaparin  (LOVENOX ) 100 MG/ML injection ADMINISTER 100 MG (1 ML) INTO THE SKIN TWICE DAILY Patient not taking: Reported on 09/13/2023 01/31/22   Brown, Fallon E, NP  oxyCODONE  (ROXICODONE ) 5 MG immediate release tablet Take 1 tablet (5 mg total) by mouth every 8 (eight) hours as needed. Patient not taking: Reported on 01/09/2022 11/10/21   Brown, Fallon E, NP  oxyCODONE -acetaminophen  (PERCOCET/ROXICET) 5-325 MG tablet Take 1 tablet by mouth every 4 (four) hours as needed for severe pain (pain score 7-10). Patient not taking: Reported on 09/13/2023 04/06/23   Sung, Jade J, MD  prochlorperazine  (COMPAZINE ) 10 MG tablet Take 1 tablet (10 mg total) by mouth every 6 (six) hours as needed for nausea or vomiting. Patient not taking: Reported on 09/13/2023 05/18/21   Willo Dunnings, MD  QUEtiapine (SEROQUEL) 300 MG tablet Take 400 mg by mouth at bedtime.    [provider]  Suvorexant (BELSOMRA) 15 MG TABS Take by mouth. Patient not taking: Reported on 09/13/2023    [provider]    Allergies as of 09/11/2023 - Reviewed 04/06/2023  Allergen Reaction Noted   Tramadol   10/19/2016   Ativan [lorazepam] Anxiety 02/16/2018    Family History  Problem Relation Age of Onset   Diabetes Mother  Cancer Mother    Cancer Father    Deep vein thrombosis Father    Diabetes Father    Breast cancer Maternal Grandmother    Breast cancer Paternal Grandmother     Social History   Socioeconomic History   Marital status: Married    Spouse name: Not on file   Number of children: Not on file   Years of education: Not on file   Highest education level: Not on file  Occupational History   Not on file  Tobacco Use   Smoking status: Every Day    Current packs/day: 1.00    Types: Cigarettes    Passive exposure: Never   Smokeless tobacco: Never  Substance and Sexual Activity   Alcohol use: Yes    Comment: 1 per month   Drug use: Yes    Types: Marijuana    Comment:  within the last month   Sexual activity: Yes  Other Topics Concern   Not on file  Social History Narrative   Not on file   Social Drivers of Health   Financial Resource Strain: Low Risk  (06/20/2023)   Received from [facility]   Overall Financial Resource Strain (CARDIA)    Difficulty of Paying Living Expenses: Not hard at all  Food Insecurity: No Food Insecurity (06/20/2023)   Received from [facility]   Hunger Vital Sign    Within the past 12 months, you worried that your food would run out before you got the money to buy more.: Never true    Within the past 12 months, the food you bought just didn't last and you didn't have money to get more.: Never true  Transportation Needs: No Transportation Needs (06/20/2023)   Received from [facility]    In the past 12 months, has lack of transportation kept you from medical appointments or from getting medications?: No    Lack of Transportation (Non-Medical): No  Physical Activity: Not on file  Stress: Not on file  Social Connections: Not on file  Intimate Partner Violence: Not on file    Review of Systems: See HPI, otherwise negative ROS  Physical Exam: Ht 5' 7 (1.702 m)   Wt 108.9 kg   LMP 12/05/2021   BMI 37.59 kg/m  General:   Alert,  pleasant and cooperative in NAD Head:  Normocephalic and atraumatic. Neck:  Supple; no masses or thyromegaly. Lungs:  Clear throughout to auscultation.    Heart:  Regular rate and rhythm. Abdomen:  Soft, nontender and nondistended. Normal bowel sounds, without guarding, and without rebound.   Neurologic:  Alert and  oriented x4;  grossly normal neurologically.  Impression/Plan: Colleen Campos is here for an colonoscopy to be performed for colon cancer screening using positive cologuard test  Risks, benefits, limitations, and alternatives regarding  colonoscopy have been reviewed with the patient.  Questions  have been answered.  All parties agreeable.   Corinn Brooklyn, MD  09/17/2023, 8:53 AM

## 2023-09-17 NOTE — Anesthesia Postprocedure Evaluation (Signed)
 Anesthesia Post Note  Patient: Colleen Campos  Procedure(s) Performed: COLONOSCOPY POLYPECTOMY, INTESTINE  Patient location during evaluation: PACU Anesthesia Type: General Level of consciousness: awake and alert Pain management: pain level controlled Vital Signs Assessment: post-procedure vital signs reviewed and stable Respiratory status: spontaneous breathing, nonlabored ventilation, respiratory function stable and patient connected to nasal cannula oxygen Cardiovascular status: blood pressure returned to baseline and stable Postop Assessment: no apparent nausea or vomiting Anesthetic complications: no   No notable events documented.   Last Vitals:  Vitals:   09/17/23 1003 09/17/23 1012  BP: 132/78 132/78  Pulse:  73  Resp:  (!) 23  Temp: 36.7 C   SpO2:  94%    Last Pain:  Vitals:   09/17/23 1012  TempSrc:   PainSc: 0-No pain                 Darcelle Herrada C Philippe Gang

## 2023-09-17 NOTE — Op Note (Signed)
 [facility] Patient Name: Colleen Campos Procedure Date: 09/17/2023 9:22 AM MRN: [id number] Account #: [id number] Date of Birth: 1970-05-22 Admit Type: Outpatient Age: 53 Room: [facility] OR ROOM 01 Gender: Female Note Status: Finalized Instrument Name: Colonoscope [id number] Procedure:             Colonoscopy Indications:           This is the patient's first colonoscopy, Positive                         Cologuard test Providers:             Corinn Jess Brooklyn MD, MD Referring MD:          Corinn Jess Brooklyn MD, MD (Referring MD), Lynwood FALCON.                         Valora, MD (Referring MD) Medicines:             General Anesthesia Complications:         No immediate complications. Estimated blood loss: None. Procedure:             Pre-Anesthesia Assessment:                        - Prior to the procedure, a History and Physical was                         performed, and patient medications and allergies were                         reviewed. The patient is competent. The risks and                         benefits of the procedure and the sedation options and                         risks were discussed with the patient. All questions                         were answered and informed consent was obtained.                         Patient identification and proposed procedure were                         verified by the physician, the nurse, the                         anesthesiologist, the anesthetist and the technician                         in the pre-procedure area in the procedure room in the                         endoscopy suite. Mental Status Examination: alert and                         oriented. Airway Examination: normal oropharyngeal  airway and neck mobility. Respiratory Examination:                         clear to auscultation. CV Examination: normal.                         Prophylactic Antibiotics: The patient does  not require                         prophylactic antibiotics. Prior Anticoagulants: The                         patient has taken no anticoagulant or antiplatelet                         agents. ASA Grade Assessment: III - A patient with                         severe systemic disease. After reviewing the risks and                         benefits, the patient was deemed in satisfactory                         condition to undergo the procedure. The anesthesia                         plan was to use general anesthesia. Immediately prior                         to administration of medications, the patient was                         re-assessed for adequacy to receive sedatives. The                         heart rate, respiratory rate, oxygen saturations,                         blood pressure, adequacy of pulmonary ventilation, and                         response to care were monitored throughout the                         procedure. The physical status of the patient was                         re-assessed after the procedure.                        After obtaining informed consent, the colonoscope was                         passed under direct vision. Throughout the procedure,                         the patient's blood pressure, pulse, and oxygen  saturations were monitored continuously. The                         Colonoscope was introduced through the anus and                         advanced to the the cecum, identified by appendiceal                         orifice and ileocecal valve. The colonoscopy was                         performed without difficulty. The patient tolerated                         the procedure well. The quality of the bowel                         preparation was good. The ileocecal valve, appendiceal                         orifice, and rectum were photographed. Findings:      The perianal and digital rectal examinations were  normal. Pertinent       negatives include normal sphincter tone and no palpable rectal lesions.      A 20 mm polyp was found in the sigmoid colon. The polyp was       pedunculated. The polyp was removed with a hot snare. Resection and       retrieval were complete. To prevent bleeding after the polypectomy, one       hemostatic clip was successfully placed (MR safe). Clip manufacturer:       [employer]. There was no bleeding at the end of the procedure.      The retroflexed view of the distal rectum and anal verge was normal and       showed no anal or rectal abnormalities.      The exam was otherwise without abnormality. Impression:            - One 20 mm polyp in the sigmoid colon, removed with a                         hot snare. Resected and retrieved. Clip manufacturer:                         [employer]. Clip (MR safe) was placed.                        - The distal rectum and anal verge are normal on                         retroflexion view.                        - The examination was otherwise normal. Recommendation:        - Discharge patient to home (with escort).                        - Resume previous diet today.                        -  Continue present medications.                        - Await pathology results.                        - Repeat colonoscopy in 3 years for surveillance. Procedure Code(s):     --- Professional ---                        [phone], Colonoscopy, flexible; with removal of                         tumor(s), polyp(s), or other lesion(s) by snare                         technique Diagnosis Code(s):     --- Professional ---                        D12.5, Benign neoplasm of sigmoid colon                        R19.5, Other fecal abnormalities CPT copyright 2022 American Medical Association. All rights reserved. The codes documented in this report are preliminary and upon coder review may  be revised to meet current compliance  requirements. Dr. Corinn Brooklyn Corinn Jess Brooklyn MD, MD 09/17/2023 10:00:00 AM This report has been signed electronically. Number of Addenda: 0 Note Initiated On: 09/17/2023 9:22 AM Scope Withdrawal Time: 0 hours 20 minutes 14 seconds  Total Procedure Duration: 0 hours 22 minutes 39 seconds  Estimated Blood Loss:  Estimated blood loss: none.      [facility]

## 2023-09-18 ENCOUNTER — Encounter: Payer: Self-pay | Admitting: Gastroenterology

## 2023-09-19 ENCOUNTER — Ambulatory Visit: Payer: Self-pay | Admitting: Gastroenterology

## 2023-09-19 LAB — SURGICAL PATHOLOGY

## 2023-09-19 NOTE — Progress Notes (Signed)
 Recommend surveillance colonoscopy in 3 years  RV

## 2023-10-02 ENCOUNTER — Emergency Department: Payer: Self-pay

## 2023-10-02 ENCOUNTER — Emergency Department
Admission: EM | Admit: 2023-10-02 | Discharge: 2023-10-02 | Disposition: A | Discharge status: Home / Self Care | Payer: Self-pay | Attending: Emergency Medicine | Admitting: Emergency Medicine

## 2023-10-02 ENCOUNTER — Other Ambulatory Visit: Payer: Self-pay

## 2023-10-02 DIAGNOSIS — W010XXA Fall on same level from slipping, tripping and stumbling without subsequent striking against object, initial encounter: Secondary | ICD-10-CM | POA: Insufficient documentation

## 2023-10-02 DIAGNOSIS — S8002XA Contusion of left knee, initial encounter: Secondary | ICD-10-CM | POA: Insufficient documentation

## 2023-10-02 MED ORDER — OXYCODONE HCL 5 MG PO TABS
5.0000 mg | ORAL_TABLET | Freq: Once | ORAL | Status: AC
  Administered 2023-10-02: 5 mg via ORAL
  Filled 2023-10-02: qty 1

## 2023-10-02 MED ORDER — OXYCODONE HCL 5 MG PO TABS: 5.0000 mg | ORAL_TABLET | Freq: Three times a day (TID) | ORAL | 0 refills | Status: DC | PRN

## 2023-10-02 NOTE — ED Provider Notes (Signed)
 [facility] Provider Note    Event Date/Time   First MD Initiated Contact with Patient 10/02/23 1304     (approximate)   History   Fall   HPI  Colleen Campos is a 53 y.o. female with a past medical history of DVT no longer on anticoagulation, anxiety, bipolar disorder, chronic venous insufficiency, PTSD, chronic low back pain, lymphedema who presents today for evaluation after a fall.  Patient reports that she had a trip and fall over her home gym and landed on her left knee.  She denies head strike or LOC.  She has attempted to weight-bear but has too much pain to ambulate.  Patient Active Problem List   Diagnosis Date Noted   Chronic venous insufficiency 07/29/2018   Lymphedema 07/29/2018   History of DVT (deep vein thrombosis) 05/17/2017   Leg pain 05/17/2017   Leg swelling 05/17/2017          Physical Exam   Triage Vital Signs: ED Triage Vitals  Encounter Vitals Group     BP 10/02/23 1257 121/78     Girls Systolic BP Percentile --      Girls Diastolic BP Percentile --      Boys Systolic BP Percentile --      Boys Diastolic BP Percentile --      Pulse Rate 10/02/23 1257 66     Resp 10/02/23 1257 18     Temp 10/02/23 1257 98 F (36.7 C)     Temp src --      SpO2 10/02/23 1257 97 %     Weight 10/02/23 1256 242 lb (109.8 kg)     Height 10/02/23 1256 5' 7 (1.702 m)     Head Circumference --      Peak Flow --      Pain Score 10/02/23 1256 10     Pain Loc --      Pain Education --      Exclude from Growth Chart --     Most recent vital signs: Vitals:   10/02/23 1257  BP: 121/78  Pulse: 66  Resp: 18  Temp: 98 F (36.7 C)  SpO2: 97%    Physical Exam Vitals and nursing note reviewed.  Constitutional:      General: Awake and alert. No acute distress.    Appearance: Normal appearance. The patient is normal weight.  HENT:     Head: Normocephalic and atraumatic.     Mouth: Mucous membranes are moist.  Eyes:     General:  PERRL. Normal EOMs        Right eye: No discharge.        Left eye: No discharge.     Conjunctiva/sclera: Conjunctivae normal.  Cardiovascular:     Rate and Rhythm: Normal rate and regular rhythm.     Pulses: Normal pulses.  Pulmonary:     Effort: Pulmonary effort is normal. No respiratory distress.     Breath sounds: Normal breath sounds.  Abdominal:     Abdomen is soft. There is no abdominal tenderness. No rebound or guarding. No distention. Musculoskeletal:        General: No swelling. Normal range of motion.     Cervical back: Normal range of motion and neck supple.  Left lower extremity: Ecchymosis noted over the anterior knee with tenderness to palpation.  Able to perform straight leg raise.  Able to flex and extend the knee, though pain with flexion.  No open wounds. Skin:  General: Skin is warm and dry.     Capillary Refill: Capillary refill takes less than 2 seconds.     Findings: No rash.  Neurological:     Mental Status: The patient is awake and alert.      ED Results / Procedures / Treatments   Labs (all labs ordered are listed, but only abnormal results are displayed) Labs Reviewed - No data to display   EKG     RADIOLOGY I independently reviewed and interpreted imaging and agree with radiologists findings.     PROCEDURES:  Critical Care performed:   Procedures   MEDICATIONS ORDERED IN ED: Medications  oxyCODONE  (Oxy IR/ROXICODONE ) immediate release tablet 5 mg (5 mg Oral Given 10/02/23 1414)     IMPRESSION / MDM / ASSESSMENT AND PLAN / ED COURSE  I reviewed the triage vital signs and the nursing notes.   Differential diagnosis includes, but is not limited to, contusion, fracture, ligamental injury.  Patient is awake and alert, neurovascularly intact.  She has obvious faint area of ecchymosis to her anterior knee though she is able to range her knee.  She has 2+ pedal pulse, sensation is intact light touch throughout her leg. X-ray obtained  reveals no acute osseous injury.  No effusion or lipohemarthrosis to suggest acute occult fracture or ligamental injury.  Patient is reassured by his findings.  She was placed in a knee able to flex there for extra support.  She was treated with 1 dose of oxycodone  with good effect.  She requested a prescription for this to take at home.  Will refill prescription for narcotics.  She was advised that she cannot take this medication with any benzodiazepines that she is prescribed.  Also advised that this medication is highly addictive and should only be used for breakthrough pain with Tylenol  and ibuprofen  do not work first.  Also advised that she cannot drive, operate heavy machinery, or perform any test that require concentration while taking this medication.  We discussed the importance of close outpatient follow-up with orthopedics we discussed return precautions and outpatient follow-up.  Patient understands and agrees with plan.  She was discharged in stable condition.    Patient's presentation is most consistent with acute complicated illness / injury requiring diagnostic workup.      FINAL CLINICAL IMPRESSION(S) / ED DIAGNOSES   Final diagnoses:  Contusion of left knee, initial encounter     Rx / DC Orders   ED Discharge Orders          Ordered    oxyCODONE  (ROXICODONE ) 5 MG immediate release tablet  Every 8 hours PRN        10/02/23 1437             Note:  This document was prepared using Dragon voice recognition software and may include unintentional dictation errors.   Md Smola E, PA-C 10/02/23 1458    Jacolyn Pae, MD 10/02/23 [phone]

## 2023-10-02 NOTE — Discharge Instructions (Addendum)
 Your x-ray does not reveal any broken bones.  Please follow-up with orthopedics.  Please keep your leg elevated and apply ice as we discussed.  You may wear the brace when out and about, and take it off when you sleep at night.  You may take the oxycodone  to help with your symptoms but remember that this medication is highly addictive and should only be used for breakthrough pain when Tylenol /ibuprofen  do not work first.  Also remember you cannot drive, operate heavy machinery, or perform any test that require concentration while taking this medication.  Also remember that you cannot take this medication with benzodiazepines including alprazolam.  It was a pleasure caring for you today.

## 2023-10-02 NOTE — ED Triage Notes (Signed)
 Pt comes with trip and fall. Pt injured her right arm, knee and leg. Pt has abrasion to right arm. Pt denies any loc or hitting her head.

## 2023-10-22 ENCOUNTER — Other Ambulatory Visit: Payer: Self-pay

## 2023-10-22 ENCOUNTER — Emergency Department
Admission: EM | Admit: 2023-10-22 | Discharge: 2023-10-22 | Disposition: A | Discharge status: Home / Self Care | Payer: Self-pay | Attending: Emergency Medicine | Admitting: Emergency Medicine

## 2023-10-22 DIAGNOSIS — L0291 Cutaneous abscess, unspecified: Secondary | ICD-10-CM

## 2023-10-22 DIAGNOSIS — L02214 Cutaneous abscess of groin: Secondary | ICD-10-CM | POA: Insufficient documentation

## 2023-10-22 LAB — COMPREHENSIVE METABOLIC PANEL WITH GFR
ALT: 15 U/L (ref 0–44)
AST: 16 U/L (ref 15–41)
Albumin: 3.6 g/dL (ref 3.5–5.0)
Alkaline Phosphatase: 82 U/L (ref 38–126)
Anion gap: 13 (ref 5–15)
BUN: 8 mg/dL (ref 6–20)
CO2: 22 mmol/L (ref 22–32)
Calcium: 9.3 mg/dL (ref 8.9–10.3)
Chloride: 102 mmol/L (ref 98–111)
Creatinine, Ser: 0.82 mg/dL (ref 0.44–1.00)
GFR, Estimated: >60 mL/min (ref >60)
Glucose, Bld: 103 mg/dL — ABNORMAL HIGH (ref 70–99)
Potassium: 4 mmol/L (ref 3.5–5.1)
Sodium: 137 mmol/L (ref 135–145)
Total Bilirubin: 0.8 mg/dL (ref 0.0–1.2)
Total Protein: 7.1 g/dL (ref 6.5–8.1)

## 2023-10-22 LAB — CBC WITH DIFFERENTIAL/PLATELET
Abs Immature Granulocytes: 0.03 K/uL (ref 0.00–0.07)
Basophils Absolute: 0 K/uL (ref 0.0–0.1)
Basophils Relative: 1 %
Eosinophils Absolute: 0.1 K/uL (ref 0.0–0.5)
Eosinophils Relative: 1 %
HCT: 49.9 % — ABNORMAL HIGH (ref 36.0–46.0)
Hemoglobin: 16.5 g/dL — ABNORMAL HIGH (ref 12.0–15.0)
Immature Granulocytes: 0 %
Lymphocytes Relative: 35 %
Lymphs Abs: 2.9 K/uL (ref 0.7–4.0)
MCH: 27.5 pg (ref 26.0–34.0)
MCHC: 33.1 g/dL (ref 30.0–36.0)
MCV: 83.2 fL (ref 80.0–100.0)
Monocytes Absolute: 0.7 K/uL (ref 0.1–1.0)
Monocytes Relative: 8 %
Neutro Abs: 4.4 K/uL (ref 1.7–7.7)
Neutrophils Relative %: 55 %
Platelets: 189 K/uL (ref 150–400)
RBC: 6 MIL/uL — ABNORMAL HIGH (ref 3.87–5.11)
RDW: 12.2 % (ref 11.5–15.5)
WBC: 8.1 K/uL (ref 4.0–10.5)
nRBC: 0 % (ref 0.0–0.2)

## 2023-10-22 LAB — LACTIC ACID, PLASMA: Lactic Acid, Venous: 1.9 mmol/L (ref 0.5–1.9)

## 2023-10-22 MED ORDER — OXYCODONE-ACETAMINOPHEN 5-325 MG PO TABS: 1.0000 | ORAL_TABLET | ORAL | Status: DC | PRN

## 2023-10-22 MED ORDER — HYDROCODONE-ACETAMINOPHEN 5-325 MG PO TABS: 1.0000 | ORAL_TABLET | Freq: Four times a day (QID) | ORAL | 0 refills | Status: DC | PRN

## 2023-10-22 MED ORDER — LIDOCAINE HCL (PF) 1 % IJ SOLN
5.0000 mL | Freq: Once | INTRAMUSCULAR | Status: AC
  Administered 2023-10-22: 5 mL via INTRADERMAL
  Filled 2023-10-22: qty 5

## 2023-10-22 MED ORDER — OXYCODONE-ACETAMINOPHEN 5-325 MG PO TABS
1.0000 | ORAL_TABLET | Freq: Once | ORAL | Status: AC
  Administered 2023-10-22: 1 via ORAL
  Filled 2023-10-22: qty 1

## 2023-10-22 NOTE — Discharge Instructions (Addendum)
 The abscess in your right groin was drained today.  Please apply warm compresses twice a day.  This will encourage any remaining infection to drain out.  You may notice a small amount of bleeding.  This is normal.  Please hold pressure over the area until it stops.  Occasionally these need to be reopened and drained again.  Please return to the emergency department if you have any worsening redness, pain, swelling or develop a fever.

## 2023-10-22 NOTE — ED Notes (Signed)
 Daugherty, PA at bedside to drain abscess

## 2023-10-22 NOTE — ED Notes (Signed)
 Patient ambulatory to restroom and back to ER stretcher. Requesting pain medication, notified Daugherty, PA via secure chat.

## 2023-10-22 NOTE — ED Triage Notes (Signed)
 Pt arrives via POV. Pt reports she has had an abscess to her right inner thigh for the past week. States the pain has become severe. Denies drainage, no fever. Pt is AxOx4.

## 2023-10-22 NOTE — ED Notes (Signed)
 Patient given discharge instructions including prescriptions x1 and importance of follow up appt as needed with stated understanding. Patient stable and wheeled out to car on dispo.

## 2023-10-22 NOTE — ED Provider Notes (Signed)
 Colleen Campos [facility] Provider Note    Event Date/Time   First MD Initiated Contact with Patient 10/22/23 1924     (approximate)   History   Abscess   HPI  Colleen Campos is a 53 y.o. female with PMH of bipolar 1, PTSD, lymphedema, DVT presents for evaluation of an abscess to the right groin for the past week.  Patient states that the pain has become progressively more severe.  No drainage.  No fevers.      Physical Exam   Triage Vital Signs: ED Triage Vitals  Encounter Vitals Group     BP 10/22/23 1509 (!) 126/95     Girls Systolic BP Percentile --      Girls Diastolic BP Percentile --      Boys Systolic BP Percentile --      Boys Diastolic BP Percentile --      Pulse Rate 10/22/23 1509 (!) 103     Resp 10/22/23 1509 20     Temp 10/22/23 1509 98.1 F (36.7 C)     Temp Source 10/22/23 1509 Oral     SpO2 10/22/23 1509 96 %     Weight --      Height --      Head Circumference --      Peak Flow --      Pain Score 10/22/23 1510 10     Pain Loc --      Pain Education --      Exclude from Growth Chart --     Most recent vital signs: Vitals:   10/22/23 2030 10/22/23 2100  BP: 138/73 (!) 141/80  Pulse: 77 76  Resp:  18  Temp:  97.7 F (36.5 C)  SpO2: 96% 95%   General: Awake, patient very uncomfortable. CV:  Good peripheral perfusion.  Resp:  Normal effort.  Abd:  No distention.  Other:  Abscess to the right groin approximately 2 cm in diameter, fluctuant, very tender to palpation with overlying erythema and surrounding induration   ED Results / Procedures / Treatments   Labs (all labs ordered are listed, but only abnormal results are displayed) Labs Reviewed  CBC WITH DIFFERENTIAL/PLATELET - Abnormal; Notable for the following components:      Result Value   RBC 6.00 (*)    Hemoglobin 16.5 (*)    HCT 49.9 (*)    All other components within normal limits  COMPREHENSIVE METABOLIC PANEL WITH GFR - Abnormal; Notable for the following  components:   Glucose, Bld 103 (*)    All other components within normal limits  LACTIC ACID, PLASMA    PROCEDURES:  Critical Care performed: No  .Incision and Drainage  Date/Time: 10/22/2023 10:07 PM  Performed by: Cleaster Tinnie LABOR, PA-C Authorized by: Cleaster Tinnie LABOR, PA-C   Consent:    Consent obtained:  Verbal   Consent given by:  Patient   Risks, benefits, and alternatives were discussed: yes     Risks discussed:  Bleeding, incomplete drainage, pain, infection and damage to other organs   Alternatives discussed:  No treatment Universal protocol:    Patient identity confirmed:  Verbally with patient Location:    Type:  Abscess   Size:  2 cm   Location:  Lower extremity   Lower extremity location:  Leg   Leg location:  R upper leg Pre-procedure details:    Skin preparation:  Povidone-iodine Sedation:    Sedation type:  None Anesthesia:    Anesthesia method:  Local infiltration   Local anesthetic:  Lidocaine  1% w/o epi Procedure type:    Complexity:  Simple Procedure details:    Incision types:  Single straight   Incision depth:  Dermal   Wound management:  Probed and deloculated and irrigated with saline   Drainage:  Bloody and purulent   Drainage amount:  Moderate   Wound treatment:  Wound left open   Packing materials:  None Post-procedure details:    Procedure completion:  Tolerated well, no immediate complications    MEDICATIONS ORDERED IN ED: Medications  oxyCODONE -acetaminophen  (PERCOCET/ROXICET) 5-325 MG per tablet 1 tablet (1 tablet Oral Given 10/22/23 1521)  oxyCODONE -acetaminophen  (PERCOCET/ROXICET) 5-325 MG per tablet 1 tablet (1 tablet Oral Given 10/22/23 2050)  lidocaine  (PF) (XYLOCAINE ) 1 % injection 5 mL (5 mLs Intradermal Given by Other 10/22/23 2051)     IMPRESSION / MDM / ASSESSMENT AND PLAN / ED COURSE  I reviewed the triage vital signs and the nursing notes.                             53 year old female presents for evaluation  of abscess to the right groin.  Blood pressure is elevated but patient is quite uncomfortable on exam.  Vital signs stable otherwise.  Differential diagnosis includes, but is not limited to, abscess, folliculitis, cellulitis.  Patient's presentation is most consistent with acute complicated illness / injury requiring diagnostic workup.  Patient's presentation is consistent with an abscess, recommended I&D.  Patient agreeable to plan.  She was very uncomfortable on exam so we will give her a dose of oral pain meds before the procedure.  Please see procedure note for further details.  Patient educated on wound care.  Did not see overlying cellulitis so do not feel that oral antibiotics are indicated.  Did discuss signs of infection for patient to watch for and reviewed return precautions.  Patient voiced understanding, all questions were answered and she was stable at discharge.     FINAL CLINICAL IMPRESSION(S) / ED DIAGNOSES   Final diagnoses:  Abscess     Rx / DC Orders   ED Discharge Orders          Ordered    HYDROcodone -acetaminophen  (NORCO/VICODIN) 5-325 MG tablet  Every 6 hours PRN        10/22/23 2210             Note:  This document was prepared using Dragon voice recognition software and may include unintentional dictation errors.   Cleaster Tinnie LABOR, PA-C 10/22/23 2211    Floy Roberts, MD 10/22/23 [phone]

## 2023-12-03 ENCOUNTER — Encounter: Payer: Self-pay | Admitting: Emergency Medicine

## 2023-12-03 ENCOUNTER — Emergency Department
Admission: EM | Admit: 2023-12-03 | Discharge: 2023-12-03 | Disposition: A | Discharge status: Home / Self Care | Payer: Self-pay | Attending: Emergency Medicine | Admitting: Emergency Medicine

## 2023-12-03 ENCOUNTER — Other Ambulatory Visit: Payer: Self-pay

## 2023-12-03 ENCOUNTER — Emergency Department: Payer: Self-pay

## 2023-12-03 DIAGNOSIS — I8001 Phlebitis and thrombophlebitis of superficial vessels of right lower extremity: Secondary | ICD-10-CM | POA: Insufficient documentation

## 2023-12-03 DIAGNOSIS — I809 Phlebitis and thrombophlebitis of unspecified site: Secondary | ICD-10-CM

## 2023-12-03 MED ORDER — HYDROCODONE-ACETAMINOPHEN 5-325 MG PO TABS: 1.0000 | ORAL_TABLET | Freq: Four times a day (QID) | ORAL | 0 refills | Status: AC | PRN

## 2023-12-03 MED ORDER — NAPROXEN 500 MG PO TABS: 500.0000 mg | ORAL_TABLET | Freq: Two times a day (BID) | ORAL | 0 refills | Status: AC

## 2023-12-03 NOTE — ED Triage Notes (Signed)
 Pt in via POV, complaints of right posterior leg pain, worsening over the last 4 days and now radiating up.  Patient concerned due to hx of DVT's.  NAD noted at this time.

## 2023-12-03 NOTE — Discharge Instructions (Signed)
 Take the naprosyn as prescribed until pain has gone.  Follow up with primary care.  Return to the ER for symptoms of concern if unable to schedule an appointment.

## 2023-12-03 NOTE — ED Provider Notes (Signed)
   [facility] Provider Note    Event Date/Time   First MD Initiated Contact with Patient 12/03/23 1648     (approximate)   History   Leg Pain   HPI  Colleen Campos is a 53 y.o. female with history of DVT, PE not currently on anticoagulants and as listed in EMR presents to the emergency department for treatment and evaluation of pain in her right calf that has been ongoing for the past 4 to 5 days.  She denies shortness of breath or chest pain.     Physical Exam    Vitals:   12/03/23 1435  BP: (!) 129/94  Pulse: 91  Resp: 18  Temp: 99.2 F (37.3 C)  SpO2: 98%    General: Awake, no distress.  CV:  Good peripheral perfusion.  Resp:  Normal effort.  Abd:  No distention.  Other:  Pain in right calf without skin changes or noted injury/wound   ED Results / Procedures / Treatments   Labs (all labs ordered are listed, but only abnormal results are displayed)  Labs Reviewed - No data to display   EKG  Not indicated   RADIOLOGY  Image and radiology report reviewed and interpreted by me. Radiology report consistent with the same.  Ultrasound positive for superficial thrombophlebitis within the great saphenous vein  PROCEDURES:  Critical Care performed: No  Procedures   MEDICATIONS ORDERED IN ED:  Medications - No data to display   IMPRESSION / MDM / ASSESSMENT AND PLAN / ED COURSE   I have reviewed the triage note and vital signs. Vital signs are stable   Differential diagnosis includes, but is not limited to, DVT, thrombophlebitis, musculoskeletal strain, cellulitis  Patient's presentation is most consistent with acute complicated illness / injury requiring diagnostic workup.  53 year old female presenting to the emergency department for treatment and evaluation of nontraumatic right calf pain that has been ongoing for the past 4 to 5 days.  See HPI for further details.  On exam, there is no obvious edema of the right  calf.  No skin changes that reasonable cellulitis and no noted wounds.  Ultrasound shows superficial thrombophlebitis of the great saphenous vein.  No DVT.  Results discussed with the patient.  She will be treated with Naprosyn twice daily.  She was also encouraged to wear compression stockings.  She is requesting pain medication until symptoms improve.  Prescriptions for Naprosyn and Norco submitted to the patient's pharmacy.      FINAL CLINICAL IMPRESSION(S) / ED DIAGNOSES   Final diagnoses:  Thrombophlebitis     Rx / DC Orders   ED Discharge Orders          Ordered    naproxen (NAPROSYN) 500 MG tablet  2 times daily with meals        12/03/23 1838    HYDROcodone -acetaminophen  (NORCO/VICODIN) 5-325 MG tablet  Every 6 hours PRN        12/03/23 1838             Note:  This document was prepared using Dragon voice recognition software and may include unintentional dictation errors.   Herlinda Kirk NOVAK, FNP 12/03/23 1845    Willo Dunnings, MD 12/03/23 [phone]

## 2024-02-28 ENCOUNTER — Other Ambulatory Visit: Payer: Self-pay | Admitting: Family Medicine

## 2024-02-28 DIAGNOSIS — R921 Mammographic calcification found on diagnostic imaging of breast: Secondary | ICD-10-CM

## 2024-03-25 ENCOUNTER — Other Ambulatory Visit: Payer: Self-pay
